# Patient Record
Sex: Female | Born: 1975 | Hispanic: Yes | Smoking: Never smoker
Health system: Southern US, Community
[De-identification: ages and names within clinical notes are randomized; demographics above are authoritative.]

## PROBLEM LIST (undated history)

## (undated) DIAGNOSIS — Z980 Intestinal bypass and anastomosis status: Secondary | ICD-10-CM

## (undated) DIAGNOSIS — G43909 Migraine, unspecified, not intractable, without status migrainosus: Secondary | ICD-10-CM

## (undated) DIAGNOSIS — F419 Anxiety disorder, unspecified: Secondary | ICD-10-CM

## (undated) DIAGNOSIS — F988 Other specified behavioral and emotional disorders with onset usually occurring in childhood and adolescence: Secondary | ICD-10-CM

## (undated) DIAGNOSIS — R5600 Simple febrile convulsions: Secondary | ICD-10-CM

## (undated) DIAGNOSIS — D649 Anemia, unspecified: Secondary | ICD-10-CM

## (undated) DIAGNOSIS — R41840 Attention and concentration deficit: Secondary | ICD-10-CM

## (undated) DIAGNOSIS — E611 Iron deficiency: Secondary | ICD-10-CM

## (undated) DIAGNOSIS — G629 Polyneuropathy, unspecified: Secondary | ICD-10-CM

## (undated) DIAGNOSIS — D508 Other iron deficiency anemias: Secondary | ICD-10-CM

## (undated) DIAGNOSIS — M1990 Unspecified osteoarthritis, unspecified site: Secondary | ICD-10-CM

## (undated) DIAGNOSIS — K219 Gastro-esophageal reflux disease without esophagitis: Secondary | ICD-10-CM

## (undated) DIAGNOSIS — Z8719 Personal history of other diseases of the digestive system: Secondary | ICD-10-CM

## (undated) DIAGNOSIS — E538 Deficiency of other specified B group vitamins: Secondary | ICD-10-CM

## (undated) HISTORY — DX: Polyneuropathy, unspecified: G62.9

## (undated) HISTORY — PX: HIATAL HERNIA REPAIR: SHX195

## (undated) HISTORY — PX: LAPAROSCOPIC REVISION OF ROUXENY WITH UPPER ENDOSCOPY: SHX5923

## (undated) HISTORY — PX: OTHER SURGICAL HISTORY: SHX169

---

## 2017-02-26 ENCOUNTER — Other Ambulatory Visit: Payer: Self-pay | Admitting: Student

## 2017-02-26 DIAGNOSIS — R1310 Dysphagia, unspecified: Secondary | ICD-10-CM

## 2017-02-26 DIAGNOSIS — K219 Gastro-esophageal reflux disease without esophagitis: Secondary | ICD-10-CM

## 2017-03-05 ENCOUNTER — Ambulatory Visit
Admission: RE | Admit: 2017-03-05 | Discharge: 2017-03-05 | Disposition: A | Discharge status: Home / Self Care | Payer: Federal, State, Local not specified - PPO | Source: Ambulatory Visit | Attending: Student | Admitting: Student

## 2017-03-05 DIAGNOSIS — R1310 Dysphagia, unspecified: Secondary | ICD-10-CM

## 2017-03-05 DIAGNOSIS — K449 Diaphragmatic hernia without obstruction or gangrene: Secondary | ICD-10-CM | POA: Diagnosis not present

## 2017-03-05 DIAGNOSIS — K219 Gastro-esophageal reflux disease without esophagitis: Secondary | ICD-10-CM | POA: Insufficient documentation

## 2017-03-10 DIAGNOSIS — K219 Gastro-esophageal reflux disease without esophagitis: Secondary | ICD-10-CM | POA: Insufficient documentation

## 2017-06-03 DIAGNOSIS — D62 Acute posthemorrhagic anemia: Secondary | ICD-10-CM | POA: Insufficient documentation

## 2017-06-30 ENCOUNTER — Encounter: Payer: Self-pay | Admitting: Emergency Medicine

## 2017-06-30 ENCOUNTER — Emergency Department
Admission: EM | Admit: 2017-06-30 | Discharge: 2017-06-30 | Disposition: A | Discharge status: Home / Self Care | Payer: Federal, State, Local not specified - PPO | Attending: Student in an Organized Health Care Education/Training Program | Admitting: Student in an Organized Health Care Education/Training Program

## 2017-06-30 DIAGNOSIS — T426X1A Poisoning by other antiepileptic and sedative-hypnotic drugs, accidental (unintentional), initial encounter: Secondary | ICD-10-CM | POA: Diagnosis not present

## 2017-06-30 DIAGNOSIS — T50901A Poisoning by unspecified drugs, medicaments and biological substances, accidental (unintentional), initial encounter: Secondary | ICD-10-CM

## 2017-06-30 LAB — COMPREHENSIVE METABOLIC PANEL
ALBUMIN: 4.3 g/dL (ref 3.5–5.0)
ALT: 11 U/L — AB (ref 14–54)
AST: 19 U/L (ref 15–41)
Alkaline Phosphatase: 65 U/L (ref 38–126)
Anion gap: 10 (ref 5–15)
BILIRUBIN TOTAL: 0.9 mg/dL (ref 0.3–1.2)
BUN: 15 mg/dL (ref 6–20)
CO2: 23 mmol/L (ref 22–32)
Calcium: 9 mg/dL (ref 8.9–10.3)
Chloride: 105 mmol/L (ref 101–111)
Creatinine, Ser: 0.88 mg/dL (ref 0.44–1.00)
GFR calc Af Amer: >60 mL/min (ref >60)
GFR calc non Af Amer: >60 mL/min (ref >60)
GLUCOSE: 117 mg/dL — AB (ref 65–99)
POTASSIUM: 3.2 mmol/L — AB (ref 3.5–5.1)
Sodium: 138 mmol/L (ref 135–145)
TOTAL PROTEIN: 7.6 g/dL (ref 6.5–8.1)

## 2017-06-30 LAB — URINE DRUG SCREEN, QUALITATIVE (ARMC ONLY)
AMPHETAMINES, UR SCREEN: POSITIVE — AB
Barbiturates, Ur Screen: NOT DETECTED
Benzodiazepine, Ur Scrn: POSITIVE — AB
Cannabinoid 50 Ng, Ur ~~LOC~~: NOT DETECTED
Cocaine Metabolite,Ur ~~LOC~~: NOT DETECTED
MDMA (Ecstasy)Ur Screen: NOT DETECTED
Methadone Scn, Ur: NOT DETECTED
OPIATE, UR SCREEN: NOT DETECTED
PHENCYCLIDINE (PCP) UR S: NOT DETECTED
Tricyclic, Ur Screen: POSITIVE — AB

## 2017-06-30 LAB — CBC
HEMATOCRIT: 34.7 % — AB (ref 35.0–47.0)
Hemoglobin: 11.3 g/dL — ABNORMAL LOW (ref 12.0–16.0)
MCH: 25 pg — ABNORMAL LOW (ref 26.0–34.0)
MCHC: 32.5 g/dL (ref 32.0–36.0)
MCV: 76.8 fL — AB (ref 80.0–100.0)
Platelets: 273 10*3/uL (ref 150–440)
RBC: 4.53 MIL/uL (ref 3.80–5.20)
RDW: 15.2 % — ABNORMAL HIGH (ref 11.5–14.5)
WBC: 5.7 10*3/uL (ref 3.6–11.0)

## 2017-06-30 LAB — ACETAMINOPHEN LEVEL: Acetaminophen (Tylenol), Serum: 13 ug/mL (ref 10–30)

## 2017-06-30 LAB — ETHANOL: Alcohol, Ethyl (B): <10 mg/dL (ref <10)

## 2017-06-30 LAB — SALICYLATE LEVEL: Salicylate Lvl: <7 mg/dL (ref 2.8–30.0)

## 2017-06-30 NOTE — Progress Notes (Signed)
Pt is a 42 y/o female who presents to [facility] ED via EMS from home relating to an accidental overdose on 2-3 Ambien. Presents sedated on initial contact with soft & slurred speech. Per pt "I didn't remember taking the first one so I took another one and fell asleep; my baby couldn't wake me up so he called 911, he's only 42 years old".  Pt denies SI, HI, AVH and pain at this time. Reports "just being tired with everything I have to do with my kids and I had so much driving to do back and forth from [place] then to [facility], I'm just too tired, I have not been sleeping but 3 hrs at most". Emotional support and encouragement offered. Safety checks initiated at Q 15 minutes intervals without self harm gestures to note at this time.

## 2017-06-30 NOTE — ED Provider Notes (Signed)
[facility] Emergency Department Provider Note    First MD Initiated Contact with Patient 06/30/17 1247     (approximate)  I have reviewed the triage vital signs and the nursing notes.   HISTORY  Chief Complaint Drug Overdose    HPI Nichole Gentry is a 42 y.o. female to the ER after accidental overdose of 2-3 of her home Ambien medications that she took around 8 AM this morning.  Patient states that she was getting home from an overnight late shift had got her eldest to school and was putting her 54-year-old down to take a nap.  She took what she thought were her morning medicines and started feeling drowsy.  She was planning on going to sleep anyways.  She looked and noted that she had already set out trazodone and Ambien to the side and realized that she had accidentally taken 2-3 additional Ambien medications.  This was unintentional.  She denies any SI or HI.  No previous history of depression or suicidal ideation.  The 26-year-old called EMS because she is they were worried about her mother being more sleepy.  DSS and CPS is involved.  On arrival by PD and EMS the mother and patient was slurring her words and did appear drowsy and intoxicated.  History reviewed. No pertinent past medical history. No family history on file. Past Surgical History:  Procedure Laterality Date  . HIATAL HERNIA REPAIR    . linx management system     There are no active problems to display for this patient.     Prior to Admission medications   Not on File    Allergies Patient has no known allergies.    Social History Social History   Tobacco Use  . Smoking status: Never Smoker  . Smokeless tobacco: Never Used  Substance Use Topics  . Alcohol use: Yes    Comment: occasionally  . Drug use: Not on file    Review of Systems Patient denies headaches, rhinorrhea, blurry vision, numbness, shortness of breath, chest pain, edema, cough, abdominal pain, nausea, vomiting,  diarrhea, dysuria, fevers, rashes or hallucinations unless otherwise stated above in HPI. ____________________________________________   PHYSICAL EXAM:  VITAL SIGNS: Vitals:   06/30/17 1220  BP: 117/84  Pulse: (!) 105  Resp: 18  Temp: 97.9 F (36.6 C)  SpO2: 100%    Constitutional: Alert and oriented. Well appearing and in no acute distress. Eyes: Conjunctivae are normal.  Head: Atraumatic. Nose: No congestion/rhinnorhea. Mouth/Throat: Mucous membranes are moist.   Neck: No stridor. Painless ROM.  Cardiovascular: Normal rate, regular rhythm. Grossly normal heart sounds.  Good peripheral circulation. Respiratory: Normal respiratory effort.  No retractions. Lungs CTAB. Gastrointestinal: Soft and nontender. No distention. No abdominal bruits. No CVA tenderness. Genitourinary:  Musculoskeletal: No lower extremity tenderness nor edema.  No joint effusions. Neurologic:  Normal speech and language. No gross focal neurologic deficits are appreciated. No facial droop Skin:  Skin is warm, dry and intact. No rash noted. Psychiatric: Mood and affect are normal. Speech and behavior are normal.  ____________________________________________   LABS (all labs ordered are listed, but only abnormal results are displayed)  Results for orders placed or performed during the hospital encounter of 06/30/17 (from the past 24 hour(s))  Comprehensive metabolic panel     Status: Abnormal   Collection Time: 06/30/17 12:27 PM  Result Value Ref Range   Sodium 138 135 - 145 mmol/L   Potassium 3.2 (L) 3.5 - 5.1 mmol/L   Chloride  105 101 - 111 mmol/L   CO2 23 22 - 32 mmol/L   Glucose, Bld 117 (H) 65 - 99 mg/dL   BUN 15 6 - 20 mg/dL   Creatinine, Ser 4.78 0.44 - 1.00 mg/dL   Calcium 9.0 8.9 - 29.5 mg/dL   Total Protein 7.6 6.5 - 8.1 g/dL   Albumin 4.3 3.5 - 5.0 g/dL   AST 19 15 - 41 U/L   ALT 11 (L) 14 - 54 U/L   Alkaline Phosphatase 65 38 - 126 U/L   Total Bilirubin 0.9 0.3 - 1.2 mg/dL   GFR  calc non Af Amer >60 >60 mL/min   GFR calc Af Amer >60 >60 mL/min   Anion gap 10 5 - 15  Ethanol     Status: None   Collection Time: 06/30/17 12:27 PM  Result Value Ref Range   Alcohol, Ethyl (B) <10 <10 mg/dL  Salicylate level     Status: None   Collection Time: 06/30/17 12:27 PM  Result Value Ref Range   Salicylate Lvl <7.0 2.8 - 30.0 mg/dL  Acetaminophen level     Status: None   Collection Time: 06/30/17 12:27 PM  Result Value Ref Range   Acetaminophen (Tylenol), Serum 13 10 - 30 ug/mL  cbc     Status: Abnormal   Collection Time: 06/30/17 12:27 PM  Result Value Ref Range   WBC 5.7 3.6 - 11.0 K/uL   RBC 4.53 3.80 - 5.20 MIL/uL   Hemoglobin 11.3 (L) 12.0 - 16.0 g/dL   HCT 62.1 (L) 30.8 - 65.7 %   MCV 76.8 (L) 80.0 - 100.0 fL   MCH 25.0 (L) 26.0 - 34.0 pg   MCHC 32.5 32.0 - 36.0 g/dL   RDW 84.6 (H) 96.2 - 95.2 %   Platelets 273 150 - 440 K/uL  Urine Drug Screen, Qualitative     Status: Abnormal   Collection Time: 06/30/17 12:27 PM  Result Value Ref Range   Tricyclic, Ur Screen POSITIVE (A) NONE DETECTED   Amphetamines, Ur Screen POSITIVE (A) NONE DETECTED   MDMA (Ecstasy)Ur Screen NONE DETECTED NONE DETECTED   Cocaine Metabolite,Ur [place] NONE DETECTED NONE DETECTED   Opiate, Ur Screen NONE DETECTED NONE DETECTED   Phencyclidine (PCP) Ur S NONE DETECTED NONE DETECTED   Cannabinoid 50 Ng, Ur [place] NONE DETECTED NONE DETECTED   Barbiturates, Ur Screen NONE DETECTED NONE DETECTED   Benzodiazepine, Ur Scrn POSITIVE (A) NONE DETECTED   Methadone Scn, Ur NONE DETECTED NONE DETECTED   ____________________________________________  EKG My review and personal interpretation at Time: 13:23   Indication: overdose  Rate: 70  Rhythm: sinus Axis: normal Other: normal intervals, no stemi ____________________________________________  RADIOLOGY  I personally reviewed all radiographic images ordered to evaluate for the above acute complaints and reviewed radiology reports and findings.   These findings were personally discussed with the patient.  Please see medical record for radiology report.  ____________________________________________   PROCEDURES  Procedure(s) performed:  Procedures    Critical Care performed: no ____________________________________________   INITIAL IMPRESSION / ASSESSMENT AND PLAN / ED COURSE  Pertinent labs & imaging results that were available during my care of the patient were reviewed by me and considered in my medical decision making (see chart for details).  DDX: Accidental overdose, intentional overdose, polysubstance abuse, SI, HI  Ellenore Roscoe is a 42 y.o. who presents to the ED with symptoms as described above.  Patient is still a bit drowsy but seems to be metabolizing  and becoming more alert.  Adamantly denies any intentional overdose.  Denies any history of SI or HI.  States she does have underlying anxiety and takes medications for that as well as ADHD.  Based on her presentation does not meeting criteria for IVC.  I will touch base with poison control to ensure there is no indication for prolonged monitoring patient otherwise appears clinically sober at this time.  Clinical Course as of Jul 01 1418  Tue Jun 30, 2017  1401 Spoke with poison control regarding presentation. Recommend 4hrs from ingestion which was roughly 8:00 this morning.  Patient sitting upright with no drowsiness.  No SI or HI.  Patient medically clear and stable for discharge home.   [PR]    Clinical Course User Index [PR] Willy Eddy, MD     As part of my medical decision making, I reviewed the following data within the electronic MEDICAL RECORD NUMBER Nursing notes reviewed and incorporated, Labs reviewed, notes from prior ED visits.   ____________________________________________   FINAL CLINICAL IMPRESSION(S) / ED DIAGNOSES  Final diagnoses:  Accidental drug overdose, initial encounter      NEW MEDICATIONS STARTED DURING THIS VISIT:  New  Prescriptions   No medications on file     Note:  This document was prepared using Dragon voice recognition software and may include unintentional dictation errors.    Willy Eddy, MD 06/30/17 1420

## 2017-06-30 NOTE — ED Notes (Signed)
Red, black and beige purse, orange tennis shoes, black t-shirt and black shorts placed in storage.

## 2017-06-30 NOTE — ED Triage Notes (Signed)
Patient presents to the ED via EMS from home.  Patient has slightly slurred speech and eyes are heavy.  Patient states she had surgery for a hiatal hernia repair approximately 2 weeks ago but since then has had, "dumping syndrome" having diarrhea anytime she ate or drank anything.  Patient states due to this she has not been sleeping well.  Patient states her psychiatrist prescribes her Remus Loffler for sleep.  Patient states she got in late last night from work.  Patient states she didn't sleep well during the night.  Patient states she, "got son ready for school and then was going to take her ambien around 11am."  Patient states she thinks she accidentally took one of her Remus Loffler and then forgot that she had and then took another one. This RN asked patient about whether she took her son to school.  She stated, " no he was outside yesterday and got hot and sort of had a fever so I was just going to keep him in today."  Patient's son called 9-1-1 because he was not able to wake patient up.  He is 43 years old.  [place] police are with son at this time at the home, waiting on social services to arrive.  Patient is short approx. 6-7 ambien.  The prescription was 06/15/17 with QTY: 30.  There is only 8 left.  Patient also has pill bottles for lorazepam 0.5mg , trazodone, Amphetamine Salts, Vyvanse, gabapentin, and & trazodone.

## 2017-06-30 NOTE — Discharge Instructions (Addendum)
Do  not drive until all effects of ambien have warn off.

## 2017-10-08 DIAGNOSIS — Z9884 Bariatric surgery status: Secondary | ICD-10-CM | POA: Insufficient documentation

## 2017-12-08 DIAGNOSIS — Z980 Intestinal bypass and anastomosis status: Secondary | ICD-10-CM | POA: Insufficient documentation

## 2017-12-21 ENCOUNTER — Other Ambulatory Visit: Payer: Self-pay

## 2017-12-21 ENCOUNTER — Ambulatory Visit
Admission: EM | Admit: 2017-12-21 | Discharge: 2017-12-21 | Disposition: A | Discharge status: Home / Self Care | Payer: Federal, State, Local not specified - PPO | Attending: Family Medicine | Admitting: Family Medicine

## 2017-12-21 ENCOUNTER — Encounter: Payer: Self-pay | Admitting: Emergency Medicine

## 2017-12-21 DIAGNOSIS — H6503 Acute serous otitis media, bilateral: Secondary | ICD-10-CM

## 2017-12-21 DIAGNOSIS — J069 Acute upper respiratory infection, unspecified: Secondary | ICD-10-CM | POA: Diagnosis not present

## 2017-12-21 HISTORY — DX: Migraine, unspecified, not intractable, without status migrainosus: G43.909

## 2017-12-21 HISTORY — DX: Gastro-esophageal reflux disease without esophagitis: K21.9

## 2017-12-21 HISTORY — DX: Other specified behavioral and emotional disorders with onset usually occurring in childhood and adolescence: F98.8

## 2017-12-21 LAB — RAPID INFLUENZA A&B ANTIGENS (ARMC ONLY)
INFLUENZA A (ARMC): NEGATIVE
INFLUENZA B (ARMC): NEGATIVE

## 2017-12-21 MED ORDER — AMOXICILLIN 875 MG PO TABS: 875.0000 mg | ORAL_TABLET | Freq: Two times a day (BID) | ORAL | 0 refills | Status: DC

## 2017-12-21 NOTE — ED Notes (Signed)
Upon discharge patient became verbally abusive to clinical staff. Patient was upset that she received antibiotics for an ear infection "virus". Patient was irate with myself and Tora Kindredaula Thomas, CMA. Patient used very colorful language with staff.

## 2017-12-21 NOTE — ED Provider Notes (Signed)
[facility]    CSN: [id number] Arrival date & time: 12/21/17  1742     History   Chief Complaint Chief Complaint  Patient presents with  . Cough  . Nasal Congestion    HPI Nichole Gentry is a 42 y.o. female.   The history is provided by the patient.  Cough  Associated symptoms: rhinorrhea   Associated symptoms: no wheezing   URI  Presenting symptoms: congestion, cough and rhinorrhea   Severity:  Moderate Onset quality:  Sudden Duration:  4 days Timing:  Constant Progression:  Unchanged Chronicity:  New Relieved by:  Nothing Ineffective treatments:  OTC medications Associated symptoms: no sinus pain and no wheezing   Risk factors: sick contacts   Risk factors: not elderly, no chronic cardiac disease, no chronic kidney disease, no chronic respiratory disease, no diabetes mellitus, no immunosuppression, no recent illness and no recent travel     Past Medical History:  Diagnosis Date  . ADD (attention deficit disorder)   . GERD (gastroesophageal reflux disease)   . Migraine     There are no active problems to display for this patient.   Past Surgical History:  Procedure Laterality Date  . HIATAL HERNIA REPAIR    . LAPAROSCOPIC REVISION OF ROUXENY WITH UPPER ENDOSCOPY    . linx management system      OB History   None      Home Medications    Prior to Admission medications   Medication Sig Start Date End Date Taking? Authorizing Provider  Butalbital-APAP-Caffeine 50-325-40 MG capsule Take by mouth.   Yes [provider]  Multiple Vitamin (MULTI-VITAMINS) TABS Take by mouth.   Yes [provider]  ondansetron (ZOFRAN-ODT) 4 MG disintegrating tablet DISSOLVE 1 TABLET IN MOUTH THREE TIMES DAILY AS NEEDED 07/21/17  Yes [provider]  pantoprazole (PROTONIX) 40 MG tablet Take by mouth.   Yes [provider]  SUMAtriptan (IMITREX) 100 MG tablet Take by mouth.   Yes [provider]  VYVANSE 70 MG  capsule TAKE 1 CAPSULE BY MOUTH IN THE MORNING ONCE DAILY FOR 30 DAYS 11/26/17  Yes [provider]  amoxicillin (AMOXIL) 875 MG tablet Take 1 tablet (875 mg total) by mouth 2 (two) times daily. 12/21/17   Payton Mccallumonty, Pamula Luther, MD    Family History Family History  Problem Relation Age of Onset  . Thyroid disease Mother   . Diabetes Mother   . Hyperlipidemia Father   . Lung cancer Father   . Brain cancer Father     Social History Social History   Tobacco Use  . Smoking status: Never Smoker  . Smokeless tobacco: Never Used  Substance Use Topics  . Alcohol use: Yes    Comment: occasionally  . Drug use: Never     Allergies   Patient has no known allergies.   Review of Systems Review of Systems  HENT: Positive for congestion and rhinorrhea. Negative for sinus pain.   Respiratory: Positive for cough. Negative for wheezing.      Physical Exam Triage Vital Signs ED Triage Vitals  Enc Vitals Group     BP 12/21/17 1800 114/83     Pulse Rate 12/21/17 1800 (!) 107     Resp 12/21/17 1800 16     Temp 12/21/17 1800 98.6 F (37 C)     Temp Source 12/21/17 1800 Oral     SpO2 12/21/17 1800 100 %     Weight 12/21/17 1800 133 lb (60.3 kg)  Height 12/21/17 1800 5\' 6"  (1.676 m)     Head Circumference --      Peak Flow --      Pain Score 12/21/17 1759 7     Pain Loc --      Pain Edu? --      Excl. in GC? --    No data found.  Updated Vital Signs BP 114/83 (BP Location: Left Arm)   Pulse (!) 107   Temp 98.6 F (37 C) (Oral)   Resp 16   Ht 5\' 6"  (1.676 m)   Wt 60.3 kg   LMP 11/30/2017 (Approximate)   SpO2 100%   BMI 21.47 kg/m   Visual Acuity Right Eye Distance:   Left Eye Distance:   Bilateral Distance:    Right Eye Near:   Left Eye Near:    Bilateral Near:     Physical Exam  Constitutional: She appears well-developed and well-nourished. No distress.  HENT:  Head: Normocephalic and atraumatic.  Right Ear: External ear and ear canal normal. Tympanic  membrane is erythematous and bulging. A middle ear effusion is present.  Left Ear: External ear and ear canal normal. Tympanic membrane is erythematous and bulging. A middle ear effusion is present.  Nose: Mucosal edema and rhinorrhea present. No nose lacerations, sinus tenderness, nasal deformity, septal deviation or nasal septal hematoma. No epistaxis.  No foreign bodies.  Mouth/Throat: Uvula is midline and mucous membranes are normal. Posterior oropharyngeal erythema present. No oropharyngeal exudate, posterior oropharyngeal edema or tonsillar abscesses. No tonsillar exudate.  Eyes: Pupils are equal, round, and reactive to light. Conjunctivae and EOM are normal. Right eye exhibits no discharge. Left eye exhibits no discharge. No scleral icterus.  Neck: Normal range of motion. Neck supple. No thyromegaly present.  Cardiovascular: Normal rate, regular rhythm and normal heart sounds.  Pulmonary/Chest: Effort normal and breath sounds normal. No respiratory distress. She has no wheezes. She has no rales.  Lymphadenopathy:    She has no cervical adenopathy.  Skin: She is not diaphoretic.  Nursing note and vitals reviewed.    UC Treatments / Results  Labs (all labs ordered are listed, but only abnormal results are displayed) Labs Reviewed  RAPID INFLUENZA A&B ANTIGENS ([facility] ONLY)    EKG None  Radiology No results found.  Procedures Procedures (including critical care time)  Medications Ordered in UC Medications - No data to display  Initial Impression / Assessment and Plan / UC Course  I have reviewed the triage vital signs and the nursing notes.  Pertinent labs & imaging results that were available during my care of the patient were reviewed by me and considered in my medical decision making (see chart for details).      Final Clinical Impressions(s) / UC Diagnoses   Final diagnoses:  Bilateral acute serous otitis media, recurrence not specified  Acute upper respiratory  infection    ED Prescriptions    Medication Sig Dispense Auth. Provider   amoxicillin (AMOXIL) 875 MG tablet Take 1 tablet (875 mg total) by mouth 2 (two) times daily. 20 tablet Payton Mccallum, MD      1. Lab results and diagnosis reviewed with patient 2. rx as per orders above; reviewed possible side effects, interactions, risks and benefits  3. Recommend supportive treatment with continued symptomatic tx with otc meds  4. Follow-up prn if symptoms worsen or don't improve   Controlled Substance Prescriptions [place] Controlled Substance Registry consulted? Not Applicable   Payton Mccallum, MD 12/21/17 Ebony Cargo

## 2017-12-21 NOTE — ED Triage Notes (Signed)
Patient in today c/o nasal congestion, cough, headache x 3 days. Patient has tried OTC Mucinex, Nyquil, Dayquil and Tessalon Perles without relief. Patient has had chills and has felt feverish, but hasn't taken her temperature.

## 2019-10-06 ENCOUNTER — Other Ambulatory Visit: Payer: Self-pay | Admitting: Gastroenterology

## 2019-10-06 DIAGNOSIS — K2100 Gastro-esophageal reflux disease with esophagitis, without bleeding: Secondary | ICD-10-CM

## 2019-10-13 ENCOUNTER — Ambulatory Visit
Admission: RE | Admit: 2019-10-13 | Discharge: 2019-10-13 | Disposition: A | Discharge status: Home / Self Care | Payer: Federal, State, Local not specified - PPO | Source: Ambulatory Visit | Attending: Gastroenterology | Admitting: Gastroenterology

## 2019-10-13 ENCOUNTER — Other Ambulatory Visit: Payer: Self-pay | Admitting: Gastroenterology

## 2019-10-13 ENCOUNTER — Other Ambulatory Visit: Payer: Self-pay

## 2019-10-13 DIAGNOSIS — K2100 Gastro-esophageal reflux disease with esophagitis, without bleeding: Secondary | ICD-10-CM

## 2019-10-17 ENCOUNTER — Encounter: Payer: Self-pay | Admitting: Hematology and Oncology

## 2019-10-17 NOTE — Progress Notes (Signed)
Patient called/ screened for oncology follow-up appointment, expresses complaints of fatigue, palpations, and lightheaded.

## 2019-10-17 NOTE — Progress Notes (Signed)
[facility]  [address], [address] [place], [place] [address] Phone: [phone]  Fax: [phone]   Clinic Day:  10/18/2019  Referring physician: [name]  Chief Complaint: Nichole Gentry is a 44 y.o. female s/p gastrojejunostomy (2019) with iron deficiency and B12 deficiency who is referred in consultation by Nichole Pat, NP for assessment and management.   HPI: The patient saw Nichole Gentry on 10/07/2019.  She was felt to have possible celiac disease based on an EGD in 2019 and elevated tissue transglutaminase IgA (30).  EGD in 2019 revealed a hiatal hernia, mildly dilated esophagus and dysmotility esophagus.  There were fundic gland polyps.  Duodenal biopsies revealed some increase in epithelial lymphocytes.  Colonoscopy on 04/22/2019 revealed only hemorrhoids.  She is on a gluten-free diet due to celiac disease.  Follow-up EGD is scheduled.  The patient had a Linx band placed in 2019. She did not tolerate this well and underwent Linx removal and gastrojejunostomy on 12/07/2017. The surgery was supposed to control her reflux but it did not help. She is dependent on Protonix 40 mg BID.   The patient states that she was anemic before the surgeries. She has a low B12. She has tried oral iron in the past but could not tolerate it due to nausea and vomiting.  She has never received IV iron. She has received 4 weekly B12 injections (last one this morning) and will now be switching to monthly. She receives the injections at the GI clinic and states that she does not feel any better.  Labs followed: 06/03/2017:  Hematocrit 32.4, hemoglobin 10.2, MCV  79.0, platelets 207,000, WBC 4,600. 10/21/2017:  Hematocrit 34.4, hemoglobin 10.9, MCV  70.7, platelets 262,000, WBC 5,600. Ferritin 4. Iron saturation   3%. TIBC 488.0. Vitamin B12 152. 12/09/2017:  Hematocrit 29.0, hemoglobin   8.6, MCV  80.0, platelets 178,000, WBC 4,900. 03/30/2019:  Hematocrit  34.6, hemoglobin 10.5, MCV  73.9, platelets 315,000, WBC 6,900. Ferritin 4. Iron saturation   4%. TIBC 599.5. Vitamin B12 145. 10/07/2019:  Hematocrit 29.9, hemoglobin   8.9, MCV  73.1, platelets 266,000, WBC 5400.  Ferritin 3. Iron saturation 10%  TIBC 406.2. Vitamin B12 131.  Symptomatically, she is doing "ok".  She reports fatigue, drenching night sweats several times per month, blurry vision, photosensitivity, palpitations x 1 week, loose stools, reflux, nausea, urinary frequency, occasional ankle and toe cramping, frequent headaches, 2 syncopal episodes over the past few years,  and lightheadedness. She has ice pica, restless legs, and is always cold.  The patient denies fevers, headaches, changes in vision, runny nose, dry mouth, sore throat, cough, shortness of breath, chest pain, vomiting, reflux, bone or joint symptoms, skin changes, numbness, and bleeding of any kind.  The patient does not eat bread. She tries to eat mostly chicken and fish because they are easier to chew. She occasionally eats ground beef or ground [place]. She does not eat a lot of carbs or sweets.  Screening mammogram on 04/07/2019 revealed no evidence of malignancy.  The patient's brother had a clotting disorder. The patient states that "his body forms extra clots" and he is on a blood thinner. Her father passed from lung cancer with brain metastasis. Her son had follicular thyroid cancer in his early 31s and underwent thyroidectomy.   Past Medical History:  Diagnosis Date  . ADD (attention deficit disorder)   . GERD (gastroesophageal reflux disease)   . Migraine     Past Surgical History:  Procedure Laterality  Date  . HIATAL HERNIA REPAIR    . LAPAROSCOPIC REVISION OF ROUXENY WITH UPPER ENDOSCOPY    . linx management system      Family History  Problem Relation Age of Onset  . Thyroid disease Mother   . Diabetes Mother   . Hyperlipidemia Father   . Lung cancer Father   . Brain cancer Father      Social History:  reports that she has never smoked. She has never used smokeless tobacco. She reports current alcohol use. She reports that she does not use drugs. She does not smoke. She drinks alcohol occasionally. 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 She lives in [place]. The patient is alone today.  Allergies: No Known Allergies  Current Medications: Current Outpatient Medications  Medication Sig Dispense Refill  . amphetamine-dextroamphetamine (ADDERALL) 10 MG tablet Take by mouth.    . Butalbital-APAP-Caffeine 50-325-40 MG capsule Take by mouth.     . cyanocobalamin (,VITAMIN B-12,) 1000 MCG/ML injection Inject into the muscle.    . meloxicam (MOBIC) 7.5 MG tablet Take 7.5 mg by mouth daily.    . metroNIDAZOLE (FLAGYL) 500 MG tablet SMARTSIG:1 Tablet(s) By Mouth Every 12 Hours    . Multiple Vitamin (MULTI-VITAMINS) TABS Take by mouth.     . pantoprazole (PROTONIX) 40 MG tablet Take by mouth.    . SUMAtriptan (IMITREX) 100 MG tablet Take by mouth.    Marland Kitchen VYVANSE 70 MG capsule TAKE 1 CAPSULE BY MOUTH IN THE MORNING ONCE DAILY FOR 30 DAYS  0  . zolpidem (AMBIEN) 10 MG tablet Take by mouth.    Marland Kitchen amoxicillin (AMOXIL) 875 MG tablet Take 1 tablet (875 mg total) by mouth 2 (two) times daily. (Patient not taking: Reported on 10/17/2019) 20 tablet 0  . ondansetron (ZOFRAN-ODT) 4 MG disintegrating tablet DISSOLVE 1 TABLET IN MOUTH THREE TIMES DAILY AS NEEDED (Patient not taking: Reported on 10/17/2019)     No current facility-administered medications for this visit.    Review of Systems  Constitutional: Positive for chills (at night), diaphoresis (drenching night sweats) and malaise/fatigue. Negative for fever and weight loss.       "Tired all the time".  HENT: Negative for congestion, ear discharge, ear pain, hearing loss, nosebleeds, sinus pain, sore throat and tinnitus.        Dry mouth.  Eyes: Positive for blurred vision and photophobia. Negative for double vision.  Respiratory:  Negative for cough, hemoptysis, sputum production and shortness of breath.   Cardiovascular: Negative for chest pain, palpitations and leg swelling.  Gastrointestinal: Positive for heartburn and nausea (depending on what she eats). Negative for abdominal pain, blood in stool, constipation, diarrhea (loose stools), melena and vomiting.       Ice pica.  Genitourinary: Positive for frequency. Negative for dysuria, hematuria and urgency.  Musculoskeletal: Positive for myalgias (occasional ankle and toe cramping). Negative for back pain, joint pain and neck pain.  Skin: Negative for itching and rash.  Neurological: Positive for dizziness (lightheaded, multiple syncopal episodes over the past 2 years) and headaches (frequent). Negative for tingling, sensory change and weakness.       Restless legs.  Endo/Heme/Allergies: Does not bruise/bleed easily.       Always cold.  Psychiatric/Behavioral: Negative for depression and memory loss. The patient is not nervous/anxious and does not have insomnia.   All other systems reviewed and are negative.  Performance status (ECOG): 1  Vitals Blood pressure 111/81, pulse 79, temperature 97.7 F (36.5 C), temperature source  Tympanic, resp. rate 18, height 5\' 6"  (1.676 m), weight 136 lb 11 oz (62 kg), SpO2 100 %.   Physical Exam Vitals and nursing note reviewed.  Constitutional:      General: She is not in acute distress.    Appearance: She is not diaphoretic.  HENT:     Head: Normocephalic and atraumatic.     Comments: Brown hair pulled up.    Mouth/Throat:     Mouth: Mucous membranes are dry.     Pharynx: Oropharynx is clear.  Eyes:     General: No scleral icterus.    Extraocular Movements: Extraocular movements intact.     Conjunctiva/sclera: Conjunctivae normal.     Pupils: Pupils are equal, round, and reactive to light.     Comments: Brown/dark hazel eyes.  Cardiovascular:     Rate and Rhythm: Normal rate and regular rhythm.     Heart sounds:  Normal heart sounds. No murmur heard.   Pulmonary:     Effort: Pulmonary effort is normal. No respiratory distress.     Breath sounds: Normal breath sounds. No wheezing or rales.  Chest:     Chest wall: No tenderness.  Abdominal:     General: Bowel sounds are normal. There is no distension.     Palpations: Abdomen is soft. There is no hepatomegaly, splenomegaly or mass.     Tenderness: There is abdominal tenderness (mild). There is no guarding or rebound.  Musculoskeletal:        General: No swelling or tenderness. Normal range of motion.     Cervical back: Normal range of motion and neck supple.  Lymphadenopathy:     Head:     Right side of head: No preauricular, posterior auricular or occipital adenopathy.     Left side of head: No preauricular, posterior auricular or occipital adenopathy.     Cervical: No cervical adenopathy.     Upper Body:     Right upper body: No supraclavicular or axillary adenopathy.     Left upper body: No supraclavicular or axillary adenopathy.     Lower Body: No right inguinal adenopathy. No left inguinal adenopathy.  Skin:    General: Skin is warm and dry.     Comments: Tan.  Neurological:     Mental Status: She is alert and oriented to person, place, and time.  Psychiatric:        Behavior: Behavior normal.        Thought Content: Thought content normal.        Judgment: Judgment normal.    No visits with results within 3 Day(s) from this visit.  Latest known visit with results is:  Admission on 12/21/2017, Discharged on 12/21/2017  Component Date Value Ref Range Status  . Influenza A ([facility]) 12/21/2017 NEGATIVE  NEGATIVE Final  . Influenza B ([facility]) 12/21/2017 NEGATIVE  NEGATIVE Final   Performed at [facility], [address], [place], [place]    Assessment:  Nichole Gentry is a 44 y.o. female s/p gastric bypass surgery and possible celiac disease with iron deficiency and B12 deficiency.  She is intolerant of oral  iron.  Labs on 10/07/2019 revealed a hematocrit 29.9, hemoglobin 8.9, MCV 73.1, platelets 266,000, WBC 5400.  Ferritin was 3. Iron saturation was 10% with a TIBC 406.2. Vitamin B12 was 131.  She has iron deficiency.  Ferritin was 3 on 10/07/2019.  She has B12 deficiency. She received B12 weekly x4 (last 10/18/2019).  EGD in 2019 revealed a  hiatal hernia, mildly dilated esophagus and dysmotility esophagus.  There were fundic gland polyps.  Duodenal biopsies revealed some increase in epithelial lymphocytes.  Colonoscopy on 04/22/2019 revealed only hemorrhoids.  She is on a gluten-free diet.  Follow-up EGD is scheduled.  Symptomatically, she notes fatigue, drenching night sweats several times per month, loose stools, reflux, nausea, frequent headaches, 2 syncopal episodes over the past few years,and lightheadedness. She has ice pica, restless legs, and always feels cold. Exam is unremarkable.  Plan: 1.   Labs today:  CBC with diff, folate. 2.   Iron deficiency  Patient has iron deficiency anemia likely secondary to gastrojejunostomy and possible celiac disease.  She describes anemia prior to her surgeries.  EGD and colonoscopy revealed no evidence of blood loss.  She is intolerant of oral iron.  Discuss consideration of IV iron.   Potential side effects reviewed.  Information provided.   Patient consented to treatment.  Preauth Venofer-done.  Venofer today. Urine pregnancy test required prior to infusions. 3.   B12 deficiency  Patient has B12 deficiency likely secondary to gastrojejunostomy.  B12 was 131 on 10/07/2019.  B12 goal is 400.  She has received B12 weekly x 4 (last this morning).  Anticipate B12 monthly.   Preauth B12-done. 4.  RTC in 1 week for MD assessment, review of work-up, urine pregnancy test, and week #2 Venofer +/- B12.  I discussed the assessment and treatment plan with the patient.  The patient was provided an opportunity to ask questions and all were answered.  The  patient agreed with the plan and demonstrated an understanding of the instructions.  The patient was advised to call back if the symptoms worsen or if the condition fails to improve as anticipated.  I provided 26 minutes of face-to-face time during this this encounter and > 50% was spent counseling as documented under my assessment and plan. An additional 15 minutes were spent reviewing her chart (Epic and Care Everywhere) including notes, labs, and imaging studies.    Agostino Gorin C. Merlene Pullingorcoran, MD, PhD    10/18/2019, 10:54 AM  I, Danella PentonEmily J Tufford, am acting as Neurosurgeonscribe for General MotorsMelissa C. Merlene Pullingorcoran, MD, PhD.  I, Leana Springston C. Merlene Pullingorcoran, MD, have reviewed the above documentation for accuracy and completeness, and I agree with the above.

## 2019-10-18 ENCOUNTER — Inpatient Hospital Stay: Payer: Federal, State, Local not specified - PPO

## 2019-10-18 ENCOUNTER — Other Ambulatory Visit: Payer: Self-pay

## 2019-10-18 ENCOUNTER — Inpatient Hospital Stay
Payer: Federal, State, Local not specified - PPO | Attending: Hematology and Oncology | Admitting: Hematology and Oncology

## 2019-10-18 ENCOUNTER — Encounter: Payer: Self-pay | Admitting: Hematology and Oncology

## 2019-10-18 VITALS — BP 111/81 | HR 79 | Temp 97.7°F | Resp 18 | Ht 66.0 in | Wt 136.7 lb

## 2019-10-18 VITALS — BP 110/76 | HR 76 | Resp 18

## 2019-10-18 DIAGNOSIS — K9589 Other complications of other bariatric procedure: Secondary | ICD-10-CM

## 2019-10-18 DIAGNOSIS — Z791 Long term (current) use of non-steroidal anti-inflammatories (NSAID): Secondary | ICD-10-CM | POA: Insufficient documentation

## 2019-10-18 DIAGNOSIS — Z9884 Bariatric surgery status: Secondary | ICD-10-CM | POA: Insufficient documentation

## 2019-10-18 DIAGNOSIS — D508 Other iron deficiency anemias: Secondary | ICD-10-CM

## 2019-10-18 DIAGNOSIS — E538 Deficiency of other specified B group vitamins: Secondary | ICD-10-CM | POA: Insufficient documentation

## 2019-10-18 DIAGNOSIS — D509 Iron deficiency anemia, unspecified: Secondary | ICD-10-CM

## 2019-10-18 DIAGNOSIS — Z79899 Other long term (current) drug therapy: Secondary | ICD-10-CM | POA: Diagnosis not present

## 2019-10-18 DIAGNOSIS — K219 Gastro-esophageal reflux disease without esophagitis: Secondary | ICD-10-CM | POA: Insufficient documentation

## 2019-10-18 LAB — VITAMIN B12: Vitamin B-12: >7500 pg/mL — ABNORMAL HIGH (ref 180–914)

## 2019-10-18 LAB — PREGNANCY, URINE: Preg Test, Ur: NEGATIVE

## 2019-10-18 LAB — CBC WITH DIFFERENTIAL/PLATELET
Abs Immature Granulocytes: 0.01 10*3/uL (ref 0.00–0.07)
Basophils Absolute: 0 10*3/uL (ref 0.0–0.1)
Basophils Relative: 1 %
Eosinophils Absolute: 0 10*3/uL (ref 0.0–0.5)
Eosinophils Relative: 1 %
HCT: 30.8 % — ABNORMAL LOW (ref 36.0–46.0)
Hemoglobin: 9.5 g/dL — ABNORMAL LOW (ref 12.0–15.0)
Immature Granulocytes: 0 %
Lymphocytes Relative: 49 %
Lymphs Abs: 2.2 10*3/uL (ref 0.7–4.0)
MCH: 21.7 pg — ABNORMAL LOW (ref 26.0–34.0)
MCHC: 30.8 g/dL (ref 30.0–36.0)
MCV: 70.5 fL — ABNORMAL LOW (ref 80.0–100.0)
Monocytes Absolute: 0.3 10*3/uL (ref 0.1–1.0)
Monocytes Relative: 8 %
Neutro Abs: 1.8 10*3/uL (ref 1.7–7.7)
Neutrophils Relative %: 41 %
Platelets: 301 10*3/uL (ref 150–400)
RBC: 4.37 MIL/uL (ref 3.87–5.11)
RDW: 16.1 % — ABNORMAL HIGH (ref 11.5–15.5)
WBC: 4.4 10*3/uL (ref 4.0–10.5)
nRBC: 0 % (ref 0.0–0.2)

## 2019-10-18 LAB — FOLATE: Folate: 18.1 ng/mL (ref >5.9)

## 2019-10-18 MED ORDER — CYANOCOBALAMIN 1000 MCG/ML IJ SOLN: 1000.0000 ug | Freq: Once | INTRAMUSCULAR | Status: DC

## 2019-10-18 MED ORDER — SODIUM CHLORIDE 0.9 % IV SOLN
Freq: Once | INTRAVENOUS | Status: AC
  Filled 2019-10-18: qty 250

## 2019-10-18 MED ORDER — SODIUM CHLORIDE 0.9 % IV SOLN: 200.0000 mg | Freq: Once | INTRAVENOUS | Status: DC

## 2019-10-18 MED ORDER — IRON SUCROSE 20 MG/ML IV SOLN
200.0000 mg | Freq: Once | INTRAVENOUS | Status: AC
  Administered 2019-10-18: 200 mg via INTRAVENOUS
  Filled 2019-10-18: qty 10

## 2019-10-18 NOTE — Patient Instructions (Signed)

## 2019-10-25 ENCOUNTER — Inpatient Hospital Stay: Payer: Federal, State, Local not specified - PPO

## 2019-10-25 ENCOUNTER — Inpatient Hospital Stay: Payer: Federal, State, Local not specified - PPO | Admitting: Hematology and Oncology

## 2019-10-29 NOTE — Progress Notes (Signed)
[facility]  [address] [place], [place] [address] Phone: [phone]  Fax: [phone]   Clinic Day:  10/31/2019  Referring physician: Evelene Croon, MD  Chief Complaint: Nichole Gentry is a 44 y.o. female with iron deficiency and B12 deficiency who is seen for review of work-up and continuation of weekly Venofer.   HPI: The patient was last seen in the hematology clinic on 10/18/2019 for initial consultation. At that time, she described fatigue, drenching night sweats, muscle cramping, headaches, ice pica, and cold chills. Hematocrit was 30.8, hemoglobin 9.5, MCV 70.5, platelets 301,000, WBC 4400, ANC 1800. Vit B12 was >7500. Folate was 18.1  During the interim, the patient stated feeling "busy". She tolerated IV iron well. She felt more tired after her infusion x 2 days. She had dark urine after IV iron; urine was brown in color. Tissue upon wiping was brown in color. Two days after infusion her urine was slightly brown and cleared by the third day. She reports being so busy that she has a foggy memory. She has had drenching sweats on and off since 2019. Her symptoms are unchanged since last visit. Denies any recent syncope episode.   She reports an upcoming appointment with GI for an ultrasound/EGD.   Pharmacist confirmed 5% of patient have urine color changes after IV Venofer. Patient was made aware and understood.    Past Medical History:  Diagnosis Date  . ADD (attention deficit disorder)   . GERD (gastroesophageal reflux disease)   . Migraine     Past Surgical History:  Procedure Laterality Date  . HIATAL HERNIA REPAIR    . LAPAROSCOPIC REVISION OF ROUXENY WITH UPPER ENDOSCOPY    . linx management system      Family History  Problem Relation Age of Onset  . Thyroid disease Mother   . Diabetes Mother   . Hyperlipidemia Father   . Lung cancer Father   . Brain cancer Father     Social History:  reports that she has never  smoked. She has never used smokeless tobacco. She reports current alcohol use. She reports that she does not use drugs. The patient is alone today.  Allergies: No Known Allergies  Current Medications: Current Outpatient Medications  Medication Sig Dispense Refill  . amoxicillin (AMOXIL) 875 MG tablet Take 1 tablet (875 mg total) by mouth 2 (two) times daily. (Patient not taking: Reported on 10/17/2019) 20 tablet 0  . amphetamine-dextroamphetamine (ADDERALL) 10 MG tablet Take by mouth.    . Butalbital-APAP-Caffeine 50-325-40 MG capsule Take by mouth.     . meloxicam (MOBIC) 7.5 MG tablet Take 7.5 mg by mouth daily.    . metroNIDAZOLE (FLAGYL) 500 MG tablet SMARTSIG:1 Tablet(s) By Mouth Every 12 Hours    . Multiple Vitamin (MULTI-VITAMINS) TABS Take by mouth.     . ondansetron (ZOFRAN-ODT) 4 MG disintegrating tablet DISSOLVE 1 TABLET IN MOUTH THREE TIMES DAILY AS NEEDED (Patient not taking: Reported on 10/17/2019)    . pantoprazole (PROTONIX) 40 MG tablet Take by mouth.    . SUMAtriptan (IMITREX) 100 MG tablet Take by mouth.    Marland Kitchen VYVANSE 70 MG capsule TAKE 1 CAPSULE BY MOUTH IN THE MORNING ONCE DAILY FOR 30 DAYS  0  . zolpidem (AMBIEN) 10 MG tablet Take by mouth.     No current facility-administered medications for this visit.    Review of Systems  Constitutional: Positive for chills (at night), diaphoresis (drenching night sweats since 2019) and malaise/fatigue. Negative for  fever and weight loss (up 9 lbs).       Patient busy working in laptop upon entry to room. Feels "busy".  HENT: Negative for congestion and sore throat.        Dry mouth.  Eyes: Positive for blurred vision and photophobia. Negative for double vision.  Respiratory: Negative for cough and shortness of breath.   Cardiovascular: Negative for chest pain, palpitations and leg swelling.  Gastrointestinal: Positive for heartburn and nausea (Depending on food intake). Negative for constipation, diarrhea and vomiting.       Ice  pica.  Genitourinary: Positive for frequency. Negative for urgency.       Dark/brown urine after IV iron.  Musculoskeletal: Positive for myalgias (foot cramping). Negative for back pain and falls.  Skin: Negative for rash.  Neurological: Positive for dizziness (lightheaded, multiple syncopal episodes over the past 2 years) and headaches (frequent). Negative for sensory change and weakness.       Reports restless legs  Endo/Heme/Allergies: Does not bruise/bleed easily.       Always cold  Psychiatric/Behavioral: Negative for depression. The patient is not nervous/anxious.   All other systems reviewed and are negative.  Performance status (ECOG): 1-2  Vitals Blood pressure 121/77, pulse 94, temperature 98 F (36.7 C), temperature source Tympanic, weight 145 lb 8.1 oz (66 kg), SpO2 100 %.   Physical Exam Vitals and nursing note reviewed.  Constitutional:      General: She is not in acute distress.    Appearance: Normal appearance. She is well-developed.     Interventions: Face mask in place.     Comments: Patient on laptop working upon entering room.  HENT:     Head: Normocephalic and atraumatic.     Comments: Long brown hair.    Mouth/Throat:     Mouth: Mucous membranes are moist. No oral lesions.  Eyes:     Conjunctiva/sclera: Conjunctivae normal.     Pupils: Pupils are equal, round, and reactive to light.     Comments: Brown eyes.  Cardiovascular:     Rate and Rhythm: Normal rate and regular rhythm.     Heart sounds: Normal heart sounds. No murmur heard.  No friction rub. No gallop.   Pulmonary:     Effort: Pulmonary effort is normal. No respiratory distress.     Breath sounds: Normal breath sounds. No wheezing, rhonchi or rales.  Abdominal:     General: Bowel sounds are normal.     Palpations: Abdomen is soft. There is no hepatomegaly, splenomegaly or mass.     Tenderness: There is no abdominal tenderness. There is no guarding or rebound.  Musculoskeletal:         General: No tenderness. Normal range of motion.     Cervical back: Normal range of motion and neck supple.  Lymphadenopathy:     Head:     Right side of head: No preauricular, posterior auricular or occipital adenopathy.     Left side of head: No preauricular, posterior auricular or occipital adenopathy.     Cervical: No cervical adenopathy.     Upper Body:     Right upper body: No supraclavicular or axillary adenopathy.     Left upper body: No supraclavicular or axillary adenopathy.     Lower Body: No right inguinal adenopathy. No left inguinal adenopathy.  Skin:    General: Skin is warm and dry.     Findings: No bruising, erythema, lesion or rash.  Neurological:     Mental Status: She  is alert and oriented to person, place, and time.  Psychiatric:        Behavior: Behavior normal.        Thought Content: Thought content normal.        Judgment: Judgment normal.    No visits with results within 3 Day(s) from this visit.  Latest known visit with results is:  Appointment on 10/18/2019  Component Date Value Ref Range Status  . Preg Test, Ur 10/18/2019 NEGATIVE  NEGATIVE Final   Performed at [facility], [address] [place], [place] [address]  . Folate 10/18/2019 18.1  >5.9 ng/mL Final   Performed at [facility], [address] [place], [place] [address]  . Vitamin B-12 10/18/2019 >7,500* 180 - 914 pg/mL Final   Performed at [facility], [address] [place], [place] [address]  . WBC 10/18/2019 4.4  4.0 - 10.5 K/uL Final  . RBC 10/18/2019 4.37  3.87 - 5.11 MIL/uL Final  . Hemoglobin 10/18/2019 9.5* 12.0 - 15.0 g/dL Final  . HCT 91/47/829509/14/2021 30.8* 36 - 46 % Final  . MCV 10/18/2019 70.5* 80.0 - 100.0 fL Final  . MCH 10/18/2019 21.7* 26.0 - 34.0 pg Final  . MCHC 10/18/2019 30.8  30.0 - 36.0 g/dL Final  . RDW 62/13/086509/14/2021 16.1* 11.5 - 15.5 % Final  . Platelets 10/18/2019 301  150 - 400 K/uL Final  . nRBC 10/18/2019 0.0  0.0 - 0.2 % Final  .  Neutrophils Relative % 10/18/2019 41  % Final  . Neutro Abs 10/18/2019 1.8  1.7 - 7.7 K/uL Final  . Lymphocytes Relative 10/18/2019 49  % Final  . Lymphs Abs 10/18/2019 2.2  0.7 - 4.0 K/uL Final  . Monocytes Relative 10/18/2019 8  % Final  . Monocytes Absolute 10/18/2019 0.3  0 - 1 K/uL Final  . Eosinophils Relative 10/18/2019 1  % Final  . Eosinophils Absolute 10/18/2019 0.0  0 - 0 K/uL Final  . Basophils Relative 10/18/2019 1  % Final  . Basophils Absolute 10/18/2019 0.0  0 - 0 K/uL Final  . Immature Granulocytes 10/18/2019 0  % Final  . Abs Immature Granulocytes 10/18/2019 0.01  0.00 - 0.07 K/uL Final   Performed at [facility], [address] [place], [place] [address]    Assessment:  Roberto ScalesHolly Dwyer is a 44 y.o. female s/p gastric bypass surgery and possible celiac disease.  She is intolerant of oral iron.  Labs on 10/07/2019 revealed a hematocrit 29.9, hemoglobin 8.9, MCV 73.1, platelets 266,000, WBC 5400.  Ferritin was 3. Iron saturation was 10% with a TIBC 406.2.  Vitamin B12 was 131.  B12 was > 7500 and folate 18.1 on 10/18/2019.  She has iron deficiency.  Ferritin was 3 on 10/07/2019.  She received Venofer on 10/07/2019.  EGD in 2019 revealed a hiatal hernia, mildly dilated esophagus and dysmotility esophagus.  There were fundic gland polyps.  Duodenal biopsies revealed some increase in epithelial lymphocytes.  Colonoscopy on 04/22/2019 revealed only hemorrhoids.  She is on a gluten-free diet.  Follow-up EGD is scheduled.  She has B12 deficiency.  B12 was 131.  She received B12 weekly x 4 (last 10/18/2019).  Symptomatically, she is remains tired.  She notes change in urine color for a few days after her infusion.  Exam is stable.  Plan: 1.   Iron deficiency  Patient has iron deficiency anemia likely secondary to gastrojejunostomy and possible celiac disease.  She describes anemia prior to her surgeries.             EGD and colonoscopy revealed no  evidence of blood loss.             She is intolerant of oral iron.             She received Venofer on 10/07/2019.  Urine pregnancy test required prior to infusions. 2.   B12 deficiency  Patient has B12 deficiency likely secondary to gastrojejunostomy.             B12 was 131 on 10/07/2019 and > 7500 on 10/18/2019 (after injection).             B12 goal is 400.             She has received B12 weekly x 4 (last 10/18/2019).             Begin B12 monthly on 11/15/2019.  No B12 today. 3.   Week #2 Venofer after urine pregnancy test. 4.   RTC weekly x 2 for Venofer + urine pregnancy test. 5.   RTC in 6 weeks for labs (CBC with diff, ferritin). 5.   RTC in 3 months for MD assessment, labs (CBC with diff, ferritin, iron studies- day before) and +/- Venofer.  I discussed the assessment and treatment plan with the patient.  The patient was provided an opportunity to ask questions and all were answered.  The patient agreed with the plan and demonstrated an understanding of the instructions.  The patient was advised to call back if the symptoms worsen or if the condition fails to improve as anticipated.   Rosey Bath, MD, PhD    10/31/2019, 2:24 PM  I, Theador Hawthorne, am acting as scribe for [employer]. Merlene Pulling, MD, PhD.  I, Leona Alen C. Merlene Pulling, MD, have reviewed the above documentation for accuracy and completeness, and I agree with the above.

## 2019-10-31 ENCOUNTER — Other Ambulatory Visit: Payer: Self-pay | Admitting: Hematology and Oncology

## 2019-10-31 ENCOUNTER — Ambulatory Visit: Payer: Federal, State, Local not specified - PPO

## 2019-10-31 ENCOUNTER — Inpatient Hospital Stay (HOSPITAL_BASED_OUTPATIENT_CLINIC_OR_DEPARTMENT_OTHER): Payer: Federal, State, Local not specified - PPO | Admitting: Hematology and Oncology

## 2019-10-31 ENCOUNTER — Encounter: Payer: Self-pay | Admitting: Hematology and Oncology

## 2019-10-31 ENCOUNTER — Other Ambulatory Visit: Payer: Self-pay

## 2019-10-31 VITALS — BP 121/77 | HR 94 | Temp 98.0°F | Wt 145.5 lb

## 2019-10-31 VITALS — BP 112/72 | HR 77 | Resp 16

## 2019-10-31 DIAGNOSIS — D509 Iron deficiency anemia, unspecified: Secondary | ICD-10-CM

## 2019-10-31 DIAGNOSIS — K9589 Other complications of other bariatric procedure: Secondary | ICD-10-CM

## 2019-10-31 DIAGNOSIS — D508 Other iron deficiency anemias: Secondary | ICD-10-CM

## 2019-10-31 DIAGNOSIS — E538 Deficiency of other specified B group vitamins: Secondary | ICD-10-CM | POA: Diagnosis not present

## 2019-10-31 LAB — PREGNANCY, URINE: Preg Test, Ur: NEGATIVE

## 2019-10-31 MED ORDER — SODIUM CHLORIDE 0.9 % IV SOLN: 200.0000 mg | Freq: Once | INTRAVENOUS | Status: DC

## 2019-10-31 MED ORDER — SODIUM CHLORIDE 0.9 % IV SOLN
Freq: Once | INTRAVENOUS | Status: AC
  Filled 2019-10-31: qty 250

## 2019-10-31 MED ORDER — IRON SUCROSE 20 MG/ML IV SOLN
200.0000 mg | Freq: Once | INTRAVENOUS | Status: AC
  Administered 2019-10-31: 200 mg via INTRAVENOUS
  Filled 2019-10-31: qty 10

## 2019-10-31 NOTE — Progress Notes (Signed)
No new changes noted today 

## 2019-11-03 ENCOUNTER — Other Ambulatory Visit
Admission: RE | Admit: 2019-11-03 | Discharge: 2019-11-03 | Disposition: A | Discharge status: Home / Self Care | Payer: Federal, State, Local not specified - PPO | Source: Ambulatory Visit | Attending: Gastroenterology | Admitting: Gastroenterology

## 2019-11-03 ENCOUNTER — Other Ambulatory Visit: Payer: Self-pay

## 2019-11-03 DIAGNOSIS — Z01812 Encounter for preprocedural laboratory examination: Secondary | ICD-10-CM | POA: Diagnosis not present

## 2019-11-03 DIAGNOSIS — Z20822 Contact with and (suspected) exposure to covid-19: Secondary | ICD-10-CM | POA: Diagnosis not present

## 2019-11-03 LAB — SARS CORONAVIRUS 2 (TAT 6-24 HRS): SARS Coronavirus 2: NEGATIVE

## 2019-11-07 ENCOUNTER — Inpatient Hospital Stay: Payer: Federal, State, Local not specified - PPO

## 2019-11-07 ENCOUNTER — Ambulatory Visit: Payer: Federal, State, Local not specified - PPO | Admitting: Certified Registered"

## 2019-11-07 ENCOUNTER — Ambulatory Visit
Admission: RE | Admit: 2019-11-07 | Discharge: 2019-11-07 | Disposition: A | Discharge status: Home / Self Care | Payer: Federal, State, Local not specified - PPO | Attending: Gastroenterology | Admitting: Gastroenterology

## 2019-11-07 ENCOUNTER — Encounter
Admission: RE | Disposition: A | Discharge status: Home / Self Care | Payer: Self-pay | Source: Home / Self Care | Attending: Gastroenterology

## 2019-11-07 ENCOUNTER — Encounter: Payer: Self-pay | Admitting: *Deleted

## 2019-11-07 DIAGNOSIS — R768 Other specified abnormal immunological findings in serum: Secondary | ICD-10-CM | POA: Diagnosis present

## 2019-11-07 DIAGNOSIS — Z79899 Other long term (current) drug therapy: Secondary | ICD-10-CM | POA: Insufficient documentation

## 2019-11-07 DIAGNOSIS — K2289 Other specified disease of esophagus: Secondary | ICD-10-CM | POA: Diagnosis not present

## 2019-11-07 DIAGNOSIS — K219 Gastro-esophageal reflux disease without esophagitis: Secondary | ICD-10-CM | POA: Insufficient documentation

## 2019-11-07 DIAGNOSIS — K449 Diaphragmatic hernia without obstruction or gangrene: Secondary | ICD-10-CM | POA: Insufficient documentation

## 2019-11-07 DIAGNOSIS — Z980 Intestinal bypass and anastomosis status: Secondary | ICD-10-CM | POA: Insufficient documentation

## 2019-11-07 HISTORY — DX: Iron deficiency: E61.1

## 2019-11-07 HISTORY — DX: Deficiency of other specified B group vitamins: E53.8

## 2019-11-07 HISTORY — DX: Simple febrile convulsions: R56.00

## 2019-11-07 HISTORY — DX: Anemia, unspecified: D64.9

## 2019-11-07 HISTORY — PX: ESOPHAGOGASTRODUODENOSCOPY (EGD) WITH PROPOFOL: SHX5813

## 2019-11-07 LAB — POCT PREGNANCY, URINE: Preg Test, Ur: NEGATIVE

## 2019-11-07 SURGERY — ESOPHAGOGASTRODUODENOSCOPY (EGD) WITH PROPOFOL
Anesthesia: General

## 2019-11-07 MED ORDER — PROPOFOL 10 MG/ML IV BOLUS
INTRAVENOUS | Status: AC
  Filled 2019-11-07: qty 40

## 2019-11-07 MED ORDER — SODIUM CHLORIDE 0.9 % IV SOLN: INTRAVENOUS | Status: DC

## 2019-11-07 MED ORDER — PROPOFOL 500 MG/50ML IV EMUL
INTRAVENOUS | Status: DC | PRN
  Administered 2019-11-07: 150 ug/kg/min via INTRAVENOUS

## 2019-11-07 MED ORDER — PROPOFOL 10 MG/ML IV BOLUS
INTRAVENOUS | Status: DC | PRN
  Administered 2019-11-07: 20 mg via INTRAVENOUS
  Administered 2019-11-07: 80 mg via INTRAVENOUS
  Administered 2019-11-07: 20 mg via INTRAVENOUS

## 2019-11-07 MED ORDER — LIDOCAINE HCL (PF) 2 % IJ SOLN
INTRAMUSCULAR | Status: AC
  Filled 2019-11-07: qty 5

## 2019-11-07 NOTE — Anesthesia Postprocedure Evaluation (Signed)
Anesthesia Post Note  Patient: Nichole Gentry  Procedure(s) Performed: ESOPHAGOGASTRODUODENOSCOPY (EGD) WITH PROPOFOL (N/A )  Patient location during evaluation: Endoscopy Anesthesia Type: General Level of consciousness: awake and alert and oriented Pain management: pain level controlled Vital Signs Assessment: post-procedure vital signs reviewed and stable Respiratory status: spontaneous breathing, nonlabored ventilation and respiratory function stable Cardiovascular status: blood pressure returned to baseline and stable Postop Assessment: no signs of nausea or vomiting Anesthetic complications: no   No complications documented.   Last Vitals:  Vitals:   11/07/19 1238 11/07/19 1248  BP: 96/65 92/63  Pulse:    Resp:    Temp:    SpO2:      Last Pain:  Vitals:   11/07/19 1248  TempSrc:   PainSc: 0-No pain                 Josha Weekley

## 2019-11-07 NOTE — Anesthesia Preprocedure Evaluation (Signed)
Anesthesia Evaluation  Patient identified by MRN, date of birth, ID band Patient awake    Reviewed: Allergy & Precautions, NPO status , Patient's Chart, lab work & pertinent test results  History of Anesthesia Complications Negative for: history of anesthetic complications  Airway Mallampati: II  TM Distance: >3 FB Neck ROM: Full    Dental no notable dental hx.    Pulmonary neg pulmonary ROS, neg sleep apnea, neg COPD,    breath sounds clear to auscultation- rhonchi (-) wheezing      Cardiovascular Exercise Tolerance: Good (-) hypertension(-) CAD, (-) Past MI, (-) Cardiac Stents and (-) CABG  Rhythm:Regular Rate:Normal - Systolic murmurs and - Diastolic murmurs    Neuro/Psych  Headaches, neg Seizures PSYCHIATRIC DISORDERS    GI/Hepatic Neg liver ROS, GERD  ,  Endo/Other  negative endocrine ROSneg diabetes  Renal/GU negative Renal ROS     Musculoskeletal negative musculoskeletal ROS (+)   Abdominal (+) - obese,   Peds  Hematology  (+) anemia ,   Anesthesia Other Findings Past Medical History: No date: ADD (attention deficit disorder) No date: Anemia No date: B12 deficiency No date: GERD (gastroesophageal reflux disease) No date: Low iron No date: Migraine No date: Simple febrile convulsions (HCC)   Reproductive/Obstetrics                             Anesthesia Physical Anesthesia Plan  ASA: II  Anesthesia Plan: General   Post-op Pain Management:    Induction: Intravenous  PONV Risk Score and Plan: 2 and Propofol infusion  Airway Management Planned: Natural Airway  Additional Equipment:   Intra-op Plan:   Post-operative Plan:   Informed Consent: I have reviewed the patients History and Physical, chart, labs and discussed the procedure including the risks, benefits and alternatives for the proposed anesthesia with the patient or authorized representative who has indicated  his/her understanding and acceptance.     Dental advisory given  Plan Discussed with: CRNA and Anesthesiologist  Anesthesia Plan Comments:         Anesthesia Quick Evaluation

## 2019-11-07 NOTE — H&P (Signed)
Outpatient short stay form Pre-procedure 11/07/2019 11:53 AM Merlyn Lot MD, MPH  Primary Physician: Dr. 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 Has been eating bread for past two weeks with significant pain and bloating. No blood thinners. No family history of GI malignancies but family history of H. Pylori.   Current Facility-Administered Medications:  .  0.9 %  sodium chloride infusion, , Intravenous, Continuous, Roselia Snipe, Rossie Muskrat, MD, Last Rate: 20 mL/hr at 11/07/19 1112, New Bag at 11/07/19 1112  Medications Prior to Admission  Medication Sig Dispense Refill Last Dose  . Multiple Vitamin (MULTI-VITAMINS) TABS Take by mouth.    11/06/2019 at Unknown time  . pantoprazole (PROTONIX) 40 MG tablet Take by mouth.   11/06/2019 at Unknown time  . VYVANSE 70 MG capsule TAKE 1 CAPSULE BY MOUTH IN THE MORNING ONCE DAILY FOR 30 DAYS  0 11/06/2019 at Unknown time  . zolpidem (AMBIEN) 10 MG tablet Take by mouth.   11/06/2019 at Unknown time  . amoxicillin (AMOXIL) 875 MG tablet Take 1 tablet (875 mg total) by mouth 2 (two) times daily. (Patient not taking: Reported on 10/17/2019) 20 tablet 0 Not Taking at Unknown time  . amphetamine-dextroamphetamine (ADDERALL) 10 MG tablet Take by mouth. (Patient not taking: Reported on 11/07/2019)   Not Taking at Unknown time  . Butalbital-APAP-Caffeine 50-325-40 MG capsule Take by mouth.  (Patient not taking: Reported on 11/07/2019)   Not Taking at Unknown time  . meloxicam (MOBIC) 7.5 MG tablet Take 7.5 mg by mouth daily. (Patient not taking: Reported on 11/07/2019)   Not Taking at Unknown time  . metroNIDAZOLE (FLAGYL) 500 MG tablet SMARTSIG:1 Tablet(s) By Mouth Every 12 Hours (Patient not taking: Reported on 10/31/2019)   Not Taking at Unknown time  . ondansetron (ZOFRAN-ODT) 4 MG  disintegrating tablet DISSOLVE 1 TABLET IN MOUTH THREE TIMES DAILY AS NEEDED (Patient not taking: Reported on 10/17/2019)   Not Taking at Unknown time  . SUMAtriptan (IMITREX) 100 MG tablet Take by mouth. (Patient not taking: Reported on 11/07/2019)   Not Taking at Unknown time     No Known Allergies   Past Medical History:  Diagnosis Date  . ADD (attention deficit disorder)   . Anemia   . B12 deficiency   . GERD (gastroesophageal reflux disease)   . Low iron   . Migraine   . Simple febrile convulsions (HCC)     Review of systems:  Otherwise negative.    Physical Exam  Gen: Alert, oriented. Appears stated age.  HEENT: [place]/AT. PERRLA. Lungs: No respiratory distress Abd: soft, benign, no masses.  Ext: No edema.     Planned procedures: Proceed with EGD. The patient understands the nature of the planned procedure, indications, risks, alternatives and potential complications including but not limited to bleeding, infection, perforation, damage to internal organs and possible oversedation/side effects from anesthesia. The patient agrees and gives consent to proceed.  Please refer to procedure notes for findings, recommendations and patient disposition/instructions.     Merlyn Lot MD, MPH Gastroenterology 11/07/2019  11:53 AM

## 2019-11-07 NOTE — Anesthesia Procedure Notes (Signed)
Date/Time: 11/07/2019 2:57 PM Performed by: Stormy Fabian, CRNA Pre-anesthesia Checklist: Patient identified, Emergency Drugs available, Suction available and Patient being monitored Patient Re-evaluated:Patient Re-evaluated prior to induction Oxygen Delivery Method: Nasal cannula Induction Type: IV induction Dental Injury: Teeth and Oropharynx as per pre-operative assessment  Comments: Nasal cannula with etCO2 monitoring

## 2019-11-07 NOTE — Transfer of Care (Signed)
Immediate Anesthesia Transfer of Care Note  Patient: Nichole Gentry  Procedure(s) Performed: ESOPHAGOGASTRODUODENOSCOPY (EGD) WITH PROPOFOL (N/A )  Patient Location: PACU  Anesthesia Type:MAC  Level of Consciousness: drowsy  Airway & Oxygen Therapy: Patient Spontanous Breathing  Post-op Assessment: Report given to RN and Post -op Vital signs reviewed and stable  Post vital signs: stable  Last Vitals:  Vitals Value Taken Time  BP 106/68 11/07/19 1218  Temp 36.2 C 11/07/19 1218  Pulse 76 11/07/19 1220  Resp 20 11/07/19 1220  SpO2 95 % 11/07/19 1220  Vitals shown include unvalidated device data.  Last Pain:  Vitals:   11/07/19 1218  TempSrc: Temporal  PainSc: Asleep         Complications: No complications documented.

## 2019-11-07 NOTE — Interval H&P Note (Signed)
History and Physical Interval Note:  11/07/2019 11:56 AM  Nichole Gentry  has presented today for surgery, with the diagnosis of GERD.  The various methods of treatment have been discussed with the patient and family. After consideration of risks, benefits and other options for treatment, the patient has consented to  Procedure(s): ESOPHAGOGASTRODUODENOSCOPY (EGD) WITH PROPOFOL (N/A) as a surgical intervention.  The patient's history has been reviewed, patient examined, no change in status, stable for surgery.  I have reviewed the patient's chart and labs.  Questions were answered to the patient's satisfaction.     Regis Bill  Ok to proceed with EGD.

## 2019-11-07 NOTE — Op Note (Signed)
[facility]enter[facility] Patient Name: Nichole Gentry Procedure Date: 11/07/2019 11:43 AM MRN: [id number] Account #: [id number] Date of Birth: [birth year] Admit Type: Outpatient Age: 44 Room: [facility] ENDO ROOM 1 Gender: Female Note Status: Finalized Procedure:             Upper GI endoscopy Indications:           Gastro-esophageal reflux disease, Positive celiac                         serologies Providers:             Andrey Farmer MD, MD Referring MD:          Meindert A. Brunetta Genera, MD (Referring MD) Medicines:             Monitored Anesthesia Care Complications:         No immediate complications. Estimated blood loss:                         Minimal. Procedure:             Pre-Anesthesia Assessment:                        - Prior to the procedure, a History and Physical was                         performed, and patient medications and allergies were                         reviewed. The patient is competent. The risks and                         benefits of the procedure and the sedation options and                         risks were discussed with the patient. All questions                         were answered and informed consent was obtained.                         Patient identification and proposed procedure were                         verified by the physician, the nurse, the anesthetist                         and the technician in the endoscopy suite. Mental                         Status Examination: alert and oriented. Airway                         Examination: normal oropharyngeal airway and neck                         mobility. Respiratory Examination: clear to                         auscultation.  CV Examination: normal. Prophylactic                         Antibiotics: The patient does not require prophylactic                         antibiotics. Prior Anticoagulants: The patient has                         taken no previous anticoagulant  or antiplatelet                         agents. ASA Grade Assessment: II - A patient with mild                         systemic disease. After reviewing the risks and                         benefits, the patient was deemed in satisfactory                         condition to undergo the procedure. The anesthesia                         plan was to use monitored anesthesia care (MAC).                         Immediately prior to administration of medications,                         the patient was re-assessed for adequacy to receive                         sedatives. The heart rate, respiratory rate, oxygen                         saturations, blood pressure, adequacy of pulmonary                         ventilation, and response to care were monitored                         throughout the procedure. The physical status of the                         patient was re-assessed after the procedure.                        After obtaining informed consent, the endoscope was                         passed under direct vision. Throughout the procedure,                         the patient's blood pressure, pulse, and oxygen                         saturations were monitored continuously. The Endoscope  was introduced through the mouth, and advanced to the                         jejunum. The upper GI endoscopy was accomplished                         without difficulty. The patient tolerated the                         procedure well. Findings:      One tongue of salmon-colored mucosa was present. Biopsies were taken       with a cold forceps for histology. Estimated blood loss was minimal.      A small hiatal hernia was present.      Evidence of a Roux-en-Y gastrojejunostomy was found. The gastrojejunal       anastomosis was characterized by healthy appearing mucosa. The       jejunojejunal anastomosis was characterized by healthy appearing mucosa.       Biopsies were  taken with a cold forceps for Helicobacter pylori testing.      The examined jejunum was normal. Biopsies for histology were taken with       a cold forceps for evaluation of celiac disease. Estimated blood loss       was minimal. Impression:            - Salmon-colored mucosa suspicious for short-segment                         Barrett's esophagus. Biopsied.                        - Small hiatal hernia.                        - Roux-en-Y gastrojejunostomy with gastrojejunal                         anastomosis characterized by healthy appearing mucosa.                        - Normal examined jejunum. Biopsied. Recommendation:        - Discharge patient to home.                        - Resume previous diet.                        - Continue present medications.                        - Await pathology results.                        - Return to referring physician as previously                         scheduled. Procedure Code(s):     --- Professional ---                        [phone], Esophagogastroduodenoscopy, flexible,  transoral; with biopsy, single or multiple Diagnosis Code(s):     --- Professional ---                        K22.8, Other specified diseases of esophagus                        K44.9, Diaphragmatic hernia without obstruction or                         gangrene                        Z98.0, Intestinal bypass and anastomosis status                        K21.9, Gastro-esophageal reflux disease without                         esophagitis                        R76.8, Other specified abnormal immunological findings                         in serum CPT copyright 2019 [employer]. All rights reserved. The codes documented in this report are preliminary and upon coder review may  be revised to meet current compliance requirements. Andrey Farmer, MD Andrey Farmer MD, MD 11/07/2019 12:20:11 PM Number of Addenda: 0 Note  Initiated On: 11/07/2019 11:43 AM Estimated Blood Loss:  Estimated blood loss was minimal.      [facility]

## 2019-11-08 ENCOUNTER — Inpatient Hospital Stay: Payer: Federal, State, Local not specified - PPO

## 2019-11-08 ENCOUNTER — Encounter: Payer: Self-pay | Admitting: Gastroenterology

## 2019-11-08 ENCOUNTER — Other Ambulatory Visit: Payer: Self-pay

## 2019-11-08 ENCOUNTER — Inpatient Hospital Stay: Payer: Federal, State, Local not specified - PPO | Attending: Hematology and Oncology

## 2019-11-08 VITALS — BP 103/69 | HR 90 | Resp 18

## 2019-11-08 DIAGNOSIS — K9589 Other complications of other bariatric procedure: Secondary | ICD-10-CM

## 2019-11-08 DIAGNOSIS — D500 Iron deficiency anemia secondary to blood loss (chronic): Secondary | ICD-10-CM | POA: Insufficient documentation

## 2019-11-08 DIAGNOSIS — Z79899 Other long term (current) drug therapy: Secondary | ICD-10-CM | POA: Diagnosis not present

## 2019-11-08 DIAGNOSIS — D508 Other iron deficiency anemias: Secondary | ICD-10-CM

## 2019-11-08 LAB — SURGICAL PATHOLOGY

## 2019-11-08 MED ORDER — IRON SUCROSE 20 MG/ML IV SOLN
200.0000 mg | Freq: Once | INTRAVENOUS | Status: AC
  Administered 2019-11-08: 200 mg via INTRAVENOUS
  Filled 2019-11-08: qty 10

## 2019-11-08 MED ORDER — SODIUM CHLORIDE 0.9 % IV SOLN: 200.0000 mg | Freq: Once | INTRAVENOUS | Status: DC

## 2019-11-08 MED ORDER — SODIUM CHLORIDE 0.9 % IV SOLN
Freq: Once | INTRAVENOUS | Status: AC
  Filled 2019-11-08: qty 250

## 2019-11-14 ENCOUNTER — Inpatient Hospital Stay: Payer: Federal, State, Local not specified - PPO

## 2019-11-14 ENCOUNTER — Other Ambulatory Visit: Payer: Self-pay

## 2019-11-14 VITALS — BP 112/64 | HR 90 | Resp 18

## 2019-11-14 DIAGNOSIS — K9589 Other complications of other bariatric procedure: Secondary | ICD-10-CM

## 2019-11-14 DIAGNOSIS — D508 Other iron deficiency anemias: Secondary | ICD-10-CM

## 2019-11-14 DIAGNOSIS — D500 Iron deficiency anemia secondary to blood loss (chronic): Secondary | ICD-10-CM | POA: Diagnosis not present

## 2019-11-14 LAB — PREGNANCY, URINE: Preg Test, Ur: NEGATIVE

## 2019-11-14 MED ORDER — IRON SUCROSE 20 MG/ML IV SOLN
200.0000 mg | Freq: Once | INTRAVENOUS | Status: AC
  Administered 2019-11-14: 200 mg via INTRAVENOUS
  Filled 2019-11-14: qty 10

## 2019-11-14 MED ORDER — SODIUM CHLORIDE 0.9 % IV SOLN: 200.0000 mg | Freq: Once | INTRAVENOUS | Status: DC

## 2019-11-14 MED ORDER — SODIUM CHLORIDE 0.9 % IV SOLN
Freq: Once | INTRAVENOUS | Status: AC
  Filled 2019-11-14: qty 250

## 2019-12-12 ENCOUNTER — Other Ambulatory Visit: Payer: Federal, State, Local not specified - PPO

## 2019-12-12 ENCOUNTER — Inpatient Hospital Stay: Payer: Federal, State, Local not specified - PPO | Attending: Hematology and Oncology

## 2019-12-12 ENCOUNTER — Other Ambulatory Visit: Payer: Self-pay

## 2019-12-12 DIAGNOSIS — D509 Iron deficiency anemia, unspecified: Secondary | ICD-10-CM | POA: Diagnosis not present

## 2019-12-12 DIAGNOSIS — Z9884 Bariatric surgery status: Secondary | ICD-10-CM | POA: Diagnosis not present

## 2019-12-12 DIAGNOSIS — Z79899 Other long term (current) drug therapy: Secondary | ICD-10-CM | POA: Insufficient documentation

## 2019-12-12 DIAGNOSIS — D508 Other iron deficiency anemias: Secondary | ICD-10-CM

## 2019-12-12 DIAGNOSIS — K9589 Other complications of other bariatric procedure: Secondary | ICD-10-CM

## 2019-12-12 LAB — CBC WITH DIFFERENTIAL/PLATELET
Abs Immature Granulocytes: 0.02 10*3/uL (ref 0.00–0.07)
Basophils Absolute: 0 10*3/uL (ref 0.0–0.1)
Basophils Relative: 1 %
Eosinophils Absolute: 0.1 10*3/uL (ref 0.0–0.5)
Eosinophils Relative: 2 %
HCT: 36.4 % (ref 36.0–46.0)
Hemoglobin: 11.9 g/dL — ABNORMAL LOW (ref 12.0–15.0)
Immature Granulocytes: 0 %
Lymphocytes Relative: 40 %
Lymphs Abs: 2.4 10*3/uL (ref 0.7–4.0)
MCH: 25.9 pg — ABNORMAL LOW (ref 26.0–34.0)
MCHC: 32.7 g/dL (ref 30.0–36.0)
MCV: 79.1 fL — ABNORMAL LOW (ref 80.0–100.0)
Monocytes Absolute: 0.5 10*3/uL (ref 0.1–1.0)
Monocytes Relative: 8 %
Neutro Abs: 3 10*3/uL (ref 1.7–7.7)
Neutrophils Relative %: 49 %
Platelet Morphology: NORMAL
Platelets: 257 10*3/uL (ref 150–400)
RBC Morphology: NONE SEEN
RBC: 4.6 MIL/uL (ref 3.87–5.11)
WBC: 6 10*3/uL (ref 4.0–10.5)
nRBC: 0 % (ref 0.0–0.2)

## 2019-12-12 LAB — FERRITIN: Ferritin: 20 ng/mL (ref 11–307)

## 2019-12-13 ENCOUNTER — Telehealth: Payer: Self-pay

## 2019-12-13 ENCOUNTER — Other Ambulatory Visit: Payer: Self-pay

## 2019-12-13 NOTE — Telephone Encounter (Signed)
Spoke with the patient to inform of her labs results. The patient has been c/o numbness and blue hands with nausea and dizziness. Per Dr Merlene Pulling the patient need to be seen in [facility]. The patient was agreeable and she has been scheduled.

## 2019-12-13 NOTE — Telephone Encounter (Signed)
-----   Message from Rosey Bath, MD sent at 12/12/2019  4:54 PM EST ----- Regarding: Please call patient  Hemoglobin 11.9 (slightly low).  MCV 79.1 (low)  Ferritin 20.  Overall better, but still iron deficient.  Recommend Venofer weekly x 2 with urine pregnancy test.  M ----- Message ----- From: Interface, Lab In [place] Sent: 12/12/2019  12:14 PM EST To: Rosey Bath, MD

## 2019-12-14 ENCOUNTER — Inpatient Hospital Stay: Payer: Federal, State, Local not specified - PPO | Admitting: Oncology

## 2019-12-15 ENCOUNTER — Inpatient Hospital Stay: Payer: Federal, State, Local not specified - PPO

## 2019-12-15 ENCOUNTER — Other Ambulatory Visit: Payer: Self-pay

## 2019-12-15 VITALS — BP 110/72 | HR 88 | Resp 18

## 2019-12-15 DIAGNOSIS — K9589 Other complications of other bariatric procedure: Secondary | ICD-10-CM

## 2019-12-15 DIAGNOSIS — D509 Iron deficiency anemia, unspecified: Secondary | ICD-10-CM | POA: Diagnosis not present

## 2019-12-15 DIAGNOSIS — D508 Other iron deficiency anemias: Secondary | ICD-10-CM

## 2019-12-15 LAB — PREGNANCY, URINE: Preg Test, Ur: NEGATIVE

## 2019-12-15 MED ORDER — HEPARIN SOD (PORK) LOCK FLUSH 100 UNIT/ML IV SOLN
250.0000 [IU] | Freq: Once | INTRAVENOUS | Status: DC | PRN
  Filled 2019-12-15: qty 5

## 2019-12-15 MED ORDER — SODIUM CHLORIDE 0.9% FLUSH
10.0000 mL | Freq: Once | INTRAVENOUS | Status: DC | PRN
  Filled 2019-12-15: qty 10

## 2019-12-15 MED ORDER — SODIUM CHLORIDE 0.9% FLUSH
3.0000 mL | Freq: Once | INTRAVENOUS | Status: DC | PRN
  Filled 2019-12-15: qty 3

## 2019-12-15 MED ORDER — IRON SUCROSE 20 MG/ML IV SOLN
200.0000 mg | Freq: Once | INTRAVENOUS | Status: AC
  Administered 2019-12-15: 200 mg via INTRAVENOUS
  Filled 2019-12-15: qty 10

## 2019-12-15 MED ORDER — SODIUM CHLORIDE 0.9 % IV SOLN
Freq: Once | INTRAVENOUS | Status: AC
  Filled 2019-12-15: qty 250

## 2019-12-15 MED ORDER — HEPARIN SOD (PORK) LOCK FLUSH 100 UNIT/ML IV SOLN
500.0000 [IU] | Freq: Once | INTRAVENOUS | Status: DC | PRN
  Filled 2019-12-15: qty 5

## 2019-12-15 MED ORDER — SODIUM CHLORIDE 0.9 % IV SOLN: 200.0000 mg | Freq: Once | INTRAVENOUS | Status: DC

## 2019-12-15 MED ORDER — ALTEPLASE 2 MG IJ SOLR
2.0000 mg | Freq: Once | INTRAMUSCULAR | Status: DC | PRN
  Filled 2019-12-15: qty 2

## 2019-12-15 NOTE — Progress Notes (Signed)
Pt tolerated iron infusion without difficulty, VVS, d/ced home

## 2019-12-21 ENCOUNTER — Inpatient Hospital Stay: Payer: Federal, State, Local not specified - PPO

## 2019-12-22 ENCOUNTER — Inpatient Hospital Stay: Payer: Federal, State, Local not specified - PPO

## 2019-12-22 ENCOUNTER — Other Ambulatory Visit: Payer: Self-pay

## 2019-12-22 VITALS — BP 115/70 | HR 82 | Temp 96.3°F | Resp 16

## 2019-12-22 DIAGNOSIS — D509 Iron deficiency anemia, unspecified: Secondary | ICD-10-CM | POA: Diagnosis not present

## 2019-12-22 DIAGNOSIS — K9589 Other complications of other bariatric procedure: Secondary | ICD-10-CM

## 2019-12-22 DIAGNOSIS — D508 Other iron deficiency anemias: Secondary | ICD-10-CM

## 2019-12-22 LAB — PREGNANCY, URINE: Preg Test, Ur: NEGATIVE

## 2019-12-22 MED ORDER — SODIUM CHLORIDE 0.9 % IV SOLN: 200.0000 mg | Freq: Once | INTRAVENOUS | Status: DC

## 2019-12-22 MED ORDER — IRON SUCROSE 20 MG/ML IV SOLN
200.0000 mg | Freq: Once | INTRAVENOUS | Status: AC
  Administered 2019-12-22: 200 mg via INTRAVENOUS
  Filled 2019-12-22: qty 10

## 2019-12-22 NOTE — Progress Notes (Signed)
Venofer well tolerated. Vitals stable, discharged in stable condition.

## 2020-02-01 ENCOUNTER — Inpatient Hospital Stay: Payer: Federal, State, Local not specified - PPO

## 2020-02-01 NOTE — Progress Notes (Signed)
[facility]  [address] [place], [place] [address] Phone: [phone]  Fax: [phone]   Clinic Day:  02/02/2020  Referring physician: Evelene Croon, MD  Chief Complaint: Nichole Gentry is a 44 y.o. female s/p gastric bypass surgery with iron deficiency and B12 deficiency who is seen for 3 month assessment.  HPI: The patient was last seen in the hematology clinic on 10/31/2019. At that time, she remained tired.  She noted change in urine color for a few days after her infusion.  Exam was stable.  CBC on 10/18/2019 revealed a hematocrit of 30.8, hemoglobin 9.5, MCV 70.5, platelets 301,000, WBC 4400 (ANC 1800).  She received weekly Venofer x 3 (10/31/2019 - 11/14/2019).  EGD on 11/07/2019 by Dr. Mia Creek revealed salmon-colored mucosa suspicious for short-segment Barrett's esophagus. There was a small hiatal hernia. Roux-en-Y gastrojejunostomy with gastrojejunal anastomosis characterized by healthy appearing mucosa. Stomach and esophagus biopsies showed chronic inflammation. Negative for intestinal metaplasia, dysplasia, and malignancy.  Labs on 12/12/2019 revealed a hematocrit of 36.4, hemoglobin 11.9, platelets 257,000, WBC 6,000. Ferritin was 20.  She received weekly Venofer x 2 (12/15/2019 - 12/22/2019).  During the interim, she has been tired. She is due for B12 but needs syringes. Her urine color is back to normal. Her chills, night sweats, headaches, restless legs, dry mouth, sensitivity to light, blurred vision, heartburn, and nausea are stable. She has been eating less ice. She gets foot cramps when she is dehydrated and uses heating pads. She always feels cold.  The patient has palpitations/flutters every once in a while. She gets dizzy, lightheaded, and short of breath sometimes. She is not interested in a cardiology evaluation.   Past Medical History:  Diagnosis Date  . ADD (attention deficit disorder)   . Anemia   . B12 deficiency    . GERD (gastroesophageal reflux disease)   . Low iron   . Migraine   . Simple febrile convulsions [facility])     Past Surgical History:  Procedure Laterality Date  . ESOPHAGOGASTRODUODENOSCOPY (EGD) WITH PROPOFOL N/A 11/07/2019   Procedure: ESOPHAGOGASTRODUODENOSCOPY (EGD) WITH PROPOFOL;  Surgeon: Regis Bill, MD;  Location: [facility] ENDOSCOPY;  Service: Endoscopy;  Laterality: N/A;  . HIATAL HERNIA REPAIR    . LAPAROSCOPIC REVISION OF ROUXENY WITH UPPER ENDOSCOPY    . linx management system      Family History  Problem Relation Age of Onset  . Thyroid disease Mother   . Diabetes Mother   . Hyperlipidemia Father   . Lung cancer Father   . Brain cancer Father     Social History:  reports that she has never smoked. She has never used smokeless tobacco. She reports current alcohol use. She reports that she does not use drugs. The patient is alone today.  Allergies: No Known Allergies  Current Medications: Current Outpatient Medications  Medication Sig Dispense Refill  . cyanocobalamin (,VITAMIN B-12,) 1000 MCG/ML injection Inject 1,000 mcg into the muscle every 30 (thirty) days.    . Multiple Vitamin (MULTI-VITAMINS) TABS Take by mouth.     . pantoprazole (PROTONIX) 40 MG tablet Take by mouth.    Marland Kitchen VYVANSE 70 MG capsule TAKE 1 CAPSULE BY MOUTH IN THE MORNING ONCE DAILY FOR 30 DAYS  0  . zolpidem (AMBIEN) 10 MG tablet Take by mouth.    Marland Kitchen amoxicillin (AMOXIL) 875 MG tablet Take 1 tablet (875 mg total) by mouth 2 (two) times daily. 20 tablet 0  . amphetamine-dextroamphetamine (ADDERALL) 10 MG tablet Take  by mouth.    . Butalbital-APAP-Caffeine 50-325-40 MG capsule Take by mouth.    Marland Kitchen LORazepam (ATIVAN) 0.5 MG tablet Take 0.5 mg by mouth 2 (two) times daily as needed. (Patient not taking: Reported on 02/02/2020)    . meloxicam (MOBIC) 7.5 MG tablet Take 7.5 mg by mouth daily.    . metroNIDAZOLE (FLAGYL) 500 MG tablet SMARTSIG:1 Tablet(s) By Mouth Every 12 Hours    . ondansetron  (ZOFRAN-ODT) 4 MG disintegrating tablet DISSOLVE 1 TABLET IN MOUTH THREE TIMES DAILY AS NEEDED    . SUMAtriptan (IMITREX) 100 MG tablet Take by mouth.     No current facility-administered medications for this visit.    Review of Systems  Constitutional: Positive for chills (occasional), diaphoresis (occasional), malaise/fatigue and weight loss (3 lbs). Negative for fever.       Feels "tired."  HENT: Negative for congestion, ear discharge, ear pain, hearing loss, nosebleeds, sinus pain, sore throat and tinnitus.        Dry mouth.  Eyes: Positive for blurred vision and photophobia. Negative for double vision.  Respiratory: Negative for cough, hemoptysis, sputum production and shortness of breath.   Cardiovascular: Positive for palpitations (flutters). Negative for chest pain and leg swelling.  Gastrointestinal: Positive for heartburn and nausea (depending on food intake). Negative for abdominal pain, blood in stool, constipation, diarrhea, melena and vomiting.       Ice pica improved.  Genitourinary: Negative for dysuria, frequency, hematuria and urgency.  Musculoskeletal: Positive for myalgias (foot cramping). Negative for back pain, joint pain and neck pain.  Skin: Negative for itching and rash.  Neurological: Positive for dizziness (occasional) and headaches (occasional). Negative for tingling, sensory change and weakness.       Reports restless legs  Endo/Heme/Allergies: Does not bruise/bleed easily.       Always cold.  Psychiatric/Behavioral: Negative for depression and memory loss. The patient is not nervous/anxious and does not have insomnia.   All other systems reviewed and are negative.  Performance status (ECOG): 1  Vitals Blood pressure 115/84, pulse 74, temperature (!) 96 F (35.6 C), temperature source Tympanic, resp. rate 16, weight 142 lb 4.9 oz (64.5 kg), SpO2 100 %.   Physical Exam Vitals and nursing note reviewed.  Constitutional:      General: She is not in acute  distress.    Appearance: Normal appearance. She is well-developed.     Interventions: Face mask in place.  HENT:     Head: Normocephalic and atraumatic.     Comments: Long brown hair pulled up.    Mouth/Throat:     Mouth: Mucous membranes are moist. No oral lesions.  Eyes:     Conjunctiva/sclera: Conjunctivae normal.     Pupils: Pupils are equal, round, and reactive to light.     Comments: Brown eyes.  Cardiovascular:     Rate and Rhythm: Normal rate and regular rhythm.     Heart sounds: Normal heart sounds. No murmur heard. No friction rub. No gallop.   Pulmonary:     Effort: Pulmonary effort is normal. No respiratory distress.     Breath sounds: Normal breath sounds. No wheezing, rhonchi or rales.  Chest:  Breasts:     Right: No axillary adenopathy or supraclavicular adenopathy.     Left: No axillary adenopathy or supraclavicular adenopathy.    Abdominal:     General: Bowel sounds are normal.     Palpations: Abdomen is soft. There is no hepatomegaly, splenomegaly or mass.     Tenderness:  There is no abdominal tenderness. There is no guarding or rebound.  Musculoskeletal:        General: No tenderness. Normal range of motion.     Cervical back: Normal range of motion and neck supple.  Lymphadenopathy:     Head:     Right side of head: No preauricular, posterior auricular or occipital adenopathy.     Left side of head: No preauricular, posterior auricular or occipital adenopathy.     Cervical: No cervical adenopathy.     Upper Body:     Right upper body: No supraclavicular or axillary adenopathy.     Left upper body: No supraclavicular or axillary adenopathy.     Lower Body: No right inguinal adenopathy. No left inguinal adenopathy.  Skin:    General: Skin is warm and dry.     Findings: No bruising, erythema, lesion or rash.  Neurological:     Mental Status: She is alert and oriented to person, place, and time.  Psychiatric:        Behavior: Behavior normal.         Thought Content: Thought content normal.        Judgment: Judgment normal.    Appointment on 02/02/2020  Component Date Value Ref Range Status  . Folate 02/02/2020 4.8* >5.9 ng/mL Final   Performed at [facility], [address] [place], [place] [address]  . Vitamin B-12 02/02/2020 169* 180 - 914 pg/mL Final   Comment: (NOTE) This assay is not validated for testing neonatal or myeloproliferative syndrome specimens for Vitamin B12 levels. Performed at [facility], [address], [place], [place] [address]   . WBC 02/02/2020 5.5  4.0 - 10.5 K/uL Final  . RBC 02/02/2020 4.92  3.87 - 5.11 MIL/uL Final  . Hemoglobin 02/02/2020 14.1  12.0 - 15.0 g/dL Final  . HCT 09/81/1914 42.0  36.0 - 46.0 % Final  . MCV 02/02/2020 85.4  80.0 - 100.0 fL Final  . MCH 02/02/2020 28.7  26.0 - 34.0 pg Final  . MCHC 02/02/2020 33.6  30.0 - 36.0 g/dL Final  . RDW 78/29/5621 16.3* 11.5 - 15.5 % Final  . Platelets 02/02/2020 242  150 - 400 K/uL Final  . nRBC 02/02/2020 0.0  0.0 - 0.2 % Final  . Neutrophils Relative % 02/02/2020 46  % Final  . Neutro Abs 02/02/2020 2.6  1.7 - 7.7 K/uL Final  . Lymphocytes Relative 02/02/2020 45  % Final  . Lymphs Abs 02/02/2020 2.5  0.7 - 4.0 K/uL Final  . Monocytes Relative 02/02/2020 7  % Final  . Monocytes Absolute 02/02/2020 0.4  0.1 - 1.0 K/uL Final  . Eosinophils Relative 02/02/2020 1  % Final  . Eosinophils Absolute 02/02/2020 0.1  0.0 - 0.5 K/uL Final  . Basophils Relative 02/02/2020 1  % Final  . Basophils Absolute 02/02/2020 0.0  0.0 - 0.1 K/uL Final  . Immature Granulocytes 02/02/2020 0  % Final  . Abs Immature Granulocytes 02/02/2020 0.01  0.00 - 0.07 K/uL Final   Performed at [facility], [address], [place], [place] [address]  . Iron 02/02/2020 100  28 - 170 ug/dL Final  . TIBC 78/46/9629 388  250 - 450 ug/dL Final  . Saturation Ratios 02/02/2020 26  10.4 - 31.8 % Final  . UIBC 02/02/2020 288  ug/dL Final   Performed at  [facility], [address], [place], [place] [address]  . Ferritin 02/02/2020 34  11 -  307 ng/mL Final   Performed at [facility], [address] [place], [place] [id number]    Assessment:  Nichole Gentry is a 44 y.o. female s/p gastric bypass surgery with iron deficiency anemia.  She may have celiac disease.  She is intolerant of oral iron.  Labs on 10/07/2019 revealed a hematocrit 29.9, hemoglobin 8.9, MCV 73.1, platelets 266,000, WBC 5400.  Ferritin was 3. Iron saturation was 10% with a TIBC 406.2.  Vitamin B12 was 131.  B12 was > 7500 and folate 18.1 on 10/18/2019.  She has iron deficiency.   Ferritin has been followed:  3 on 10/07/2019 and 20 on 12/12/2019.   She received Venofer on 10/07/2019, x 3 (10/31/2019 - 11/14/2019), and x 2 (12/15/2019 - 12/22/2019).  EGD in 2019 revealed a hiatal hernia, mildly dilated esophagus and dysmotility esophagus.  There were fundic gland polyps.  Duodenal biopsies revealed some increase in epithelial lymphocytes.  Colonoscopy on 04/22/2019 revealed only hemorrhoids.  She is on a gluten-free diet.  EGD on 11/07/2019 revealed salmon-colored mucosa suspicious for short-segment Barrett's esophagus. There was a small hiatal hernia. Roux-en-Y gastrojejunostomy with gastrojejunal anastomosis characterized by healthy appearing mucosa. Stomach and esophagus biopsies showed chronic inflammation. There was no intestinal metaplasia, dysplasia, and malignancy.  She has B12 deficiency.  B12 was 131 and > 7500 (after injection) on 10/18/2019.  B12 was 169 on 02/02/2020.  She received B12 weekly x 4 (last 10/18/2019).  Symptomatically, she has been tired.  She has been eating less ice. Exam is stable.  Plan: 1.   Labs today:  CBC with diff, ferritin, iron studies, B12, folate. 2.   Iron deficiency  Hematocrit 42.0.  Hemoglobin 14.1.  MCV 85.4 on 02/02/2020.     Ferritin 34 with an iron saturation of 26% and a TIBC of 388.  Patient has iron  deficiency anemia likely secondary to gastrojejunostomy and possible celiac disease.             EGD and colonoscopy revealed no evidence of blood loss.             She is intolerant of oral iron.             Last received Venofer on 12/22/2019.  Continue to monitor. 3.   B12 deficiency  Patient has B12 deficiency likely secondary to gastrojejunostomy.             B12 was 169 today.             B12 goal is 400.             B12 today. 4.   Folate deficiency  Folate is 4.8 today.  Begin folic acid 1 mg a day with repeat labs in 1 month. 5.   No Venofer today. 6.   RN:  Please provide Rx for syringes for B12 injections at home. 7.   RTC in 1 month for labs (folate). 8.   RTC in 2 months for labs (CBC with diff, ferritin). 9.   RTC in 4 months for MD assessment, labs (CBC with diff, ferritin, iron studies- day before) and +/- Venofer.  Addendum: Patient contacted regarding low folate and need to start folic acid 1 mg a day.  Folate level will be checked in 1 month.  I discussed the assessment and treatment plan with the patient.  The patient was provided an opportunity to ask questions and all were answered.  The patient agreed with the plan and demonstrated an understanding of  the instructions.  The patient was advised to call back if the symptoms worsen or if the condition fails to improve as anticipated.   Rosey Bath, MD, PhD    02/02/2020, 2:18 PM  I, Danella Penton Tufford, am acting as Neurosurgeon for [employer]. Merlene Pulling, MD, PhD.  I, Dhruva Orndoff C. Merlene Pulling, MD, have reviewed the above documentation for accuracy and completeness, and I agree with the above.

## 2020-02-02 ENCOUNTER — Other Ambulatory Visit: Payer: Self-pay

## 2020-02-02 ENCOUNTER — Encounter: Payer: Self-pay | Admitting: Hematology and Oncology

## 2020-02-02 ENCOUNTER — Inpatient Hospital Stay: Payer: Federal, State, Local not specified - PPO

## 2020-02-02 ENCOUNTER — Telehealth: Payer: Self-pay

## 2020-02-02 ENCOUNTER — Inpatient Hospital Stay
Payer: Federal, State, Local not specified - PPO | Attending: Hematology and Oncology | Admitting: Hematology and Oncology

## 2020-02-02 ENCOUNTER — Other Ambulatory Visit: Payer: Self-pay | Admitting: Hematology and Oncology

## 2020-02-02 VITALS — BP 115/84 | HR 74 | Temp 96.0°F | Resp 16 | Wt 142.3 lb

## 2020-02-02 DIAGNOSIS — K9589 Other complications of other bariatric procedure: Secondary | ICD-10-CM | POA: Diagnosis not present

## 2020-02-02 DIAGNOSIS — Z79899 Other long term (current) drug therapy: Secondary | ICD-10-CM | POA: Insufficient documentation

## 2020-02-02 DIAGNOSIS — D508 Other iron deficiency anemias: Secondary | ICD-10-CM

## 2020-02-02 DIAGNOSIS — E538 Deficiency of other specified B group vitamins: Secondary | ICD-10-CM

## 2020-02-02 LAB — CBC WITH DIFFERENTIAL/PLATELET
Abs Immature Granulocytes: 0.01 10*3/uL (ref 0.00–0.07)
Basophils Absolute: 0 10*3/uL (ref 0.0–0.1)
Basophils Relative: 1 %
Eosinophils Absolute: 0.1 10*3/uL (ref 0.0–0.5)
Eosinophils Relative: 1 %
HCT: 42 % (ref 36.0–46.0)
Hemoglobin: 14.1 g/dL (ref 12.0–15.0)
Immature Granulocytes: 0 %
Lymphocytes Relative: 45 %
Lymphs Abs: 2.5 10*3/uL (ref 0.7–4.0)
MCH: 28.7 pg (ref 26.0–34.0)
MCHC: 33.6 g/dL (ref 30.0–36.0)
MCV: 85.4 fL (ref 80.0–100.0)
Monocytes Absolute: 0.4 10*3/uL (ref 0.1–1.0)
Monocytes Relative: 7 %
Neutro Abs: 2.6 10*3/uL (ref 1.7–7.7)
Neutrophils Relative %: 46 %
Platelets: 242 10*3/uL (ref 150–400)
RBC: 4.92 MIL/uL (ref 3.87–5.11)
RDW: 16.3 % — ABNORMAL HIGH (ref 11.5–15.5)
WBC: 5.5 10*3/uL (ref 4.0–10.5)
nRBC: 0 % (ref 0.0–0.2)

## 2020-02-02 LAB — VITAMIN B12: Vitamin B-12: 169 pg/mL — ABNORMAL LOW (ref 180–914)

## 2020-02-02 LAB — IRON AND TIBC
Iron: 100 ug/dL (ref 28–170)
Saturation Ratios: 26 % (ref 10.4–31.8)
TIBC: 388 ug/dL (ref 250–450)
UIBC: 288 ug/dL

## 2020-02-02 LAB — FERRITIN: Ferritin: 34 ng/mL (ref 11–307)

## 2020-02-02 LAB — FOLATE: Folate: 4.8 ng/mL — ABNORMAL LOW (ref >5.9)

## 2020-02-02 MED ORDER — CYANOCOBALAMIN 1000 MCG/ML IJ SOLN
1000.0000 ug | Freq: Once | INTRAMUSCULAR | Status: AC
  Administered 2020-02-02: 1000 ug via INTRAMUSCULAR

## 2020-02-02 MED ORDER — FOLIC ACID 1 MG PO TABS: 1.0000 mg | ORAL_TABLET | Freq: Every day | ORAL | 5 refills | Status: DC

## 2020-02-02 NOTE — Progress Notes (Signed)
Tolerated B12 injection well. Discharged home in stable condition.

## 2020-02-02 NOTE — Progress Notes (Signed)
No new changes noted today 

## 2020-02-02 NOTE — Telephone Encounter (Signed)
Spoke with the patient to inform her that her Folate was low and Per Dr Merlene Pulling she need to start oral Folate 1 mg daily and repaet labs in 1 month. The patient was understanding and agreeable.

## 2020-02-09 ENCOUNTER — Telehealth: Payer: Self-pay | Admitting: Hematology and Oncology

## 2020-02-10 ENCOUNTER — Telehealth: Payer: Self-pay

## 2020-02-10 NOTE — Telephone Encounter (Signed)
Pt. called and wanted to speak to nurse/MD about her B-12 injections(clarification).

## 2020-02-10 NOTE — Telephone Encounter (Signed)
Left a voicemail for patient stating the pharmacy stated she needed to ask for syringes once arrival for medication due to her insurance not covering it.

## 2020-03-01 ENCOUNTER — Other Ambulatory Visit: Payer: Federal, State, Local not specified - PPO

## 2020-03-03 DIAGNOSIS — E538 Deficiency of other specified B group vitamins: Secondary | ICD-10-CM | POA: Insufficient documentation

## 2020-03-05 ENCOUNTER — Other Ambulatory Visit: Payer: Self-pay

## 2020-03-05 ENCOUNTER — Inpatient Hospital Stay: Payer: Federal, State, Local not specified - PPO | Attending: Hematology and Oncology

## 2020-03-05 DIAGNOSIS — Z79899 Other long term (current) drug therapy: Secondary | ICD-10-CM | POA: Diagnosis not present

## 2020-03-05 DIAGNOSIS — E538 Deficiency of other specified B group vitamins: Secondary | ICD-10-CM | POA: Diagnosis not present

## 2020-03-05 DIAGNOSIS — D508 Other iron deficiency anemias: Secondary | ICD-10-CM

## 2020-03-05 LAB — FOLATE: Folate: 17.8 ng/mL (ref >5.9)

## 2020-03-07 ENCOUNTER — Other Ambulatory Visit: Payer: Self-pay

## 2020-03-07 MED ORDER — CYANOCOBALAMIN 1000 MCG/ML IJ SOLN: 1000.0000 ug | INTRAMUSCULAR | 1 refills | Status: DC

## 2020-03-07 NOTE — Progress Notes (Signed)
Patient states she has not given herself any injections at home at all. I instructed her to give herself weekly x 3 then monthly. However, she stated noone ever sent the rx to the pharmacy with instructions. I have pended a request to you. Can you please send this to pharmacy([facility] [place]). Thanks

## 2020-03-07 NOTE — Progress Notes (Signed)
Called patient to inform her that her is to continue folic acid. Patient has question regarding how much B 12 to take and if any B 12 was called into pharm and if she is to take it weekly or monthly.

## 2020-03-21 ENCOUNTER — Telehealth: Payer: Self-pay

## 2020-03-29 ENCOUNTER — Inpatient Hospital Stay: Payer: Federal, State, Local not specified - PPO

## 2020-04-05 ENCOUNTER — Other Ambulatory Visit: Payer: Self-pay

## 2020-04-05 ENCOUNTER — Inpatient Hospital Stay: Payer: Federal, State, Local not specified - PPO | Attending: Hematology and Oncology

## 2020-04-05 DIAGNOSIS — D509 Iron deficiency anemia, unspecified: Secondary | ICD-10-CM | POA: Diagnosis not present

## 2020-04-05 DIAGNOSIS — D508 Other iron deficiency anemias: Secondary | ICD-10-CM

## 2020-04-05 DIAGNOSIS — K9589 Other complications of other bariatric procedure: Secondary | ICD-10-CM

## 2020-04-05 LAB — CBC WITH DIFFERENTIAL/PLATELET
Abs Immature Granulocytes: 0.01 10*3/uL (ref 0.00–0.07)
Basophils Absolute: 0 10*3/uL (ref 0.0–0.1)
Basophils Relative: 1 %
Eosinophils Absolute: 0.1 10*3/uL (ref 0.0–0.5)
Eosinophils Relative: 1 %
HCT: 42.4 % (ref 36.0–46.0)
Hemoglobin: 14.9 g/dL (ref 12.0–15.0)
Immature Granulocytes: 0 %
Lymphocytes Relative: 36 %
Lymphs Abs: 2.1 10*3/uL (ref 0.7–4.0)
MCH: 30.9 pg (ref 26.0–34.0)
MCHC: 35.1 g/dL (ref 30.0–36.0)
MCV: 88 fL (ref 80.0–100.0)
Monocytes Absolute: 0.5 10*3/uL (ref 0.1–1.0)
Monocytes Relative: 8 %
Neutro Abs: 3.3 10*3/uL (ref 1.7–7.7)
Neutrophils Relative %: 54 %
Platelets: 281 10*3/uL (ref 150–400)
RBC: 4.82 MIL/uL (ref 3.87–5.11)
RDW: 11.9 % (ref 11.5–15.5)
WBC: 6 10*3/uL (ref 4.0–10.5)
nRBC: 0 % (ref 0.0–0.2)

## 2020-04-05 LAB — FERRITIN: Ferritin: 46 ng/mL (ref 11–307)

## 2020-04-16 ENCOUNTER — Telehealth: Payer: Self-pay

## 2020-04-16 NOTE — Telephone Encounter (Signed)
  When B12 is very low, patients receive B12 weekly x 4 then go to monthly injections.  M

## 2020-04-17 NOTE — Telephone Encounter (Signed)
  Is there anything we need to do?  M

## 2020-04-17 NOTE — Telephone Encounter (Signed)
Patient states she is aware of taking monthly b12 injections.

## 2020-04-18 NOTE — Telephone Encounter (Signed)
No nothing to be done at this time. Thanks.

## 2020-05-29 ENCOUNTER — Other Ambulatory Visit: Payer: Self-pay

## 2020-05-29 DIAGNOSIS — D508 Other iron deficiency anemias: Secondary | ICD-10-CM

## 2020-05-29 DIAGNOSIS — E538 Deficiency of other specified B group vitamins: Secondary | ICD-10-CM

## 2020-05-30 ENCOUNTER — Inpatient Hospital Stay: Payer: Federal, State, Local not specified - PPO | Attending: Oncology | Admitting: Oncology

## 2020-05-30 ENCOUNTER — Other Ambulatory Visit: Payer: Self-pay

## 2020-05-30 DIAGNOSIS — D509 Iron deficiency anemia, unspecified: Secondary | ICD-10-CM | POA: Diagnosis not present

## 2020-05-30 DIAGNOSIS — K9589 Other complications of other bariatric procedure: Secondary | ICD-10-CM

## 2020-05-30 DIAGNOSIS — Z9884 Bariatric surgery status: Secondary | ICD-10-CM | POA: Diagnosis not present

## 2020-05-30 DIAGNOSIS — R0602 Shortness of breath: Secondary | ICD-10-CM | POA: Insufficient documentation

## 2020-05-30 DIAGNOSIS — K219 Gastro-esophageal reflux disease without esophagitis: Secondary | ICD-10-CM | POA: Diagnosis not present

## 2020-05-30 DIAGNOSIS — Z79899 Other long term (current) drug therapy: Secondary | ICD-10-CM | POA: Insufficient documentation

## 2020-05-30 DIAGNOSIS — R42 Dizziness and giddiness: Secondary | ICD-10-CM | POA: Diagnosis not present

## 2020-05-30 DIAGNOSIS — E538 Deficiency of other specified B group vitamins: Secondary | ICD-10-CM | POA: Diagnosis not present

## 2020-05-30 DIAGNOSIS — D508 Other iron deficiency anemias: Secondary | ICD-10-CM

## 2020-05-30 DIAGNOSIS — R002 Palpitations: Secondary | ICD-10-CM | POA: Insufficient documentation

## 2020-05-30 LAB — CBC WITH DIFFERENTIAL/PLATELET
Abs Immature Granulocytes: 0.01 10*3/uL (ref 0.00–0.07)
Basophils Absolute: 0 10*3/uL (ref 0.0–0.1)
Basophils Relative: 1 %
Eosinophils Absolute: 0.1 10*3/uL (ref 0.0–0.5)
Eosinophils Relative: 2 %
HCT: 40.3 % (ref 36.0–46.0)
Hemoglobin: 14 g/dL (ref 12.0–15.0)
Immature Granulocytes: 0 %
Lymphocytes Relative: 34 %
Lymphs Abs: 1.6 10*3/uL (ref 0.7–4.0)
MCH: 30.9 pg (ref 26.0–34.0)
MCHC: 34.7 g/dL (ref 30.0–36.0)
MCV: 89 fL (ref 80.0–100.0)
Monocytes Absolute: 0.5 10*3/uL (ref 0.1–1.0)
Monocytes Relative: 10 %
Neutro Abs: 2.5 10*3/uL (ref 1.7–7.7)
Neutrophils Relative %: 53 %
Platelets: 252 10*3/uL (ref 150–400)
RBC: 4.53 MIL/uL (ref 3.87–5.11)
RDW: 11.8 % (ref 11.5–15.5)
WBC: 4.7 10*3/uL (ref 4.0–10.5)
nRBC: 0 % (ref 0.0–0.2)

## 2020-05-30 LAB — VITAMIN B12: Vitamin B-12: 293 pg/mL (ref 180–914)

## 2020-05-30 LAB — IRON AND TIBC
Iron: 25 ug/dL — ABNORMAL LOW (ref 28–170)
Saturation Ratios: 6 % — ABNORMAL LOW (ref 10.4–31.8)
TIBC: 448 ug/dL (ref 250–450)
UIBC: 423 ug/dL

## 2020-05-30 LAB — FERRITIN: Ferritin: 46 ng/mL (ref 11–307)

## 2020-05-31 ENCOUNTER — Encounter: Payer: Self-pay | Admitting: Oncology

## 2020-05-31 ENCOUNTER — Inpatient Hospital Stay (HOSPITAL_BASED_OUTPATIENT_CLINIC_OR_DEPARTMENT_OTHER): Payer: Federal, State, Local not specified - PPO | Admitting: Oncology

## 2020-05-31 ENCOUNTER — Ambulatory Visit: Payer: Federal, State, Local not specified - PPO

## 2020-05-31 VITALS — BP 114/82 | HR 100 | Temp 98.2°F | Resp 16 | Wt 139.9 lb

## 2020-05-31 VITALS — BP 126/85 | HR 90 | Resp 16

## 2020-05-31 DIAGNOSIS — D508 Other iron deficiency anemias: Secondary | ICD-10-CM | POA: Diagnosis not present

## 2020-05-31 DIAGNOSIS — E538 Deficiency of other specified B group vitamins: Secondary | ICD-10-CM | POA: Diagnosis not present

## 2020-05-31 DIAGNOSIS — K9589 Other complications of other bariatric procedure: Secondary | ICD-10-CM

## 2020-05-31 DIAGNOSIS — N926 Irregular menstruation, unspecified: Secondary | ICD-10-CM

## 2020-05-31 MED ORDER — SODIUM CHLORIDE 0.9 % IV SOLN: 200.0000 mg | Freq: Once | INTRAVENOUS | Status: DC

## 2020-05-31 MED ORDER — SODIUM CHLORIDE 0.9 % IV SOLN
Freq: Once | INTRAVENOUS | Status: AC
Start: 2020-05-31 — End: 2020-05-31
  Filled 2020-05-31: qty 250

## 2020-05-31 MED ORDER — IRON SUCROSE 20 MG/ML IV SOLN
200.0000 mg | Freq: Once | INTRAVENOUS | Status: AC
  Administered 2020-05-31: 200 mg via INTRAVENOUS
  Filled 2020-05-31: qty 10

## 2020-05-31 NOTE — Addendum Note (Signed)
Addended by: Charletta Cousin on: 05/31/2020 11:31 AM   Modules accepted: Orders

## 2020-05-31 NOTE — Progress Notes (Signed)
Received IV venofer today. Tolerated well. Discharged to home.

## 2020-05-31 NOTE — Progress Notes (Signed)
[facility]  [address] [place], [place] [address] Phone: [phone]  Fax: [phone]   Clinic Day:  05/31/2020  Referring physician: Evelene Croon, MD  Chief Complaint: Nichole Gentry is a 45 y.o. female s/p gastric bypass surgery with iron deficiency and B12 deficiency who is seen for 3 month assessment.  HPI: The patient was last seen in the hematology clinic on 02/01/2021. At that time she has been tired. She is due for B12 but needs syringes. Her urine color is back to normal. Her chills, night sweats, headaches, restless legs, dry mouth, sensitivity to light, blurred vision, heartburn, and nausea are stable. She has been eating less ice. She gets foot cramps when she is dehydrated and uses heating pads. She always feels cold.  The patient has palpitations/flutters every once in a while. She gets dizzy, lightheaded, and short of breath sometimes. She is not interested in a cardiology evaluation.  During the interim, she started folic acid ( ). She reports tolerating this medication.   Additionally, she has had red/purple discoloration of her fingers and toes for approximately 5 days after driving back and forth to [place]. She reports this happening one day at work, approximately 3 weeks ago.  Denies pain, trauma to arms/hands/legs/feet. Reports tingling, numbness, coldness of fingers, palms, and toes.   Reports changes in menstruation at the beginning of April, with another 4-5 days of heavy bright red bleeding starting on 05/22/20, and estimates using 4-5 tampons per day.  BC method: None.  Denies changes in sexual relationship, no concern for STD's.  She is interested ina GYN referral at this time.    Past Medical History:  Diagnosis Date  . ADD (attention deficit disorder)   . Anemia   . B12 deficiency   . GERD (gastroesophageal reflux disease)   . Low iron   . Migraine   . Simple febrile convulsions [facility])     Past  Surgical History:  Procedure Laterality Date  . ESOPHAGOGASTRODUODENOSCOPY (EGD) WITH PROPOFOL N/A 11/07/2019   Procedure: ESOPHAGOGASTRODUODENOSCOPY (EGD) WITH PROPOFOL;  Surgeon: Regis Bill, MD;  Location: [facility] ENDOSCOPY;  Service: Endoscopy;  Laterality: N/A;  . HIATAL HERNIA REPAIR    . LAPAROSCOPIC REVISION OF ROUXENY WITH UPPER ENDOSCOPY    . linx management system      Family History  Problem Relation Age of Onset  . Thyroid disease Mother   . Diabetes Mother   . Hyperlipidemia Father   . Lung cancer Father   . Brain cancer Father     Social History:  reports that she has never smoked. She has never used smokeless tobacco. She reports current alcohol use. She reports that she does not use drugs. The patient is alone today.  Allergies: No Known Allergies  Current Medications: Current Outpatient Medications  Medication Sig Dispense Refill  . amoxicillin (AMOXIL) 875 MG tablet Take 1 tablet (875 mg total) by mouth 2 (two) times daily. 20 tablet 0  . amphetamine-dextroamphetamine (ADDERALL) 10 MG tablet Take by mouth.    . Butalbital-APAP-Caffeine 50-325-40 MG capsule Take by mouth.    . cyanocobalamin (,VITAMIN B-12,) 1000 MCG/ML injection Inject 1 mL (1,000 mcg total) into the muscle every 30 (thirty) days. Administer weekly x 3 and then monthly. 1 mL 1  . folic acid (FOLVITE) 1 MG tablet Take 1 tablet (1 mg total) by mouth daily. 30 tablet 5  . LORazepam (ATIVAN) 0.5 MG tablet Take 0.5 mg by mouth 2 (two) times daily as needed. (  Patient not taking: Reported on 02/02/2020)    . meloxicam (MOBIC) 7.5 MG tablet Take 7.5 mg by mouth daily.    . metroNIDAZOLE (FLAGYL) 500 MG tablet SMARTSIG:1 Tablet(s) By Mouth Every 12 Hours    . Multiple Vitamin (MULTI-VITAMINS) TABS Take by mouth.     . ondansetron (ZOFRAN-ODT) 4 MG disintegrating tablet DISSOLVE 1 TABLET IN MOUTH THREE TIMES DAILY AS NEEDED    . pantoprazole (PROTONIX) 40 MG tablet Take by mouth.    . SUMAtriptan  (IMITREX) 100 MG tablet Take by mouth.    Marland Kitchen VYVANSE 70 MG capsule TAKE 1 CAPSULE BY MOUTH IN THE MORNING ONCE DAILY FOR 30 DAYS  0  . zolpidem (AMBIEN) 10 MG tablet Take by mouth.     No current facility-administered medications for this visit.    Review of Systems  Constitutional: Positive for chills (occasional), diaphoresis (occasional), malaise/fatigue and weight loss (3 lbs). Negative for fever.       Feels "tired." Weight fluctuations 8 pounds up and down (week to week).   HENT: Negative for congestion, ear discharge, ear pain, hearing loss, nosebleeds, sinus pain, sore throat and tinnitus.        Dry mouth.  Eyes: Positive for blurred vision and photophobia. Negative for double vision.  Respiratory: Negative for cough, hemoptysis, sputum production and shortness of breath.   Cardiovascular: Positive for palpitations (flutters). Negative for chest pain and leg swelling.  Gastrointestinal: Positive for heartburn and nausea (depending on food intake). Negative for abdominal pain, blood in stool, constipation, diarrhea, melena and vomiting.       Ice pica improved.  Genitourinary: Negative for dysuria, frequency, hematuria and urgency.  Musculoskeletal: Positive for myalgias (foot cramping). Negative for back pain, joint pain and neck pain.  Skin: Negative for itching and rash.  Neurological: Positive for dizziness (occasional) and headaches (occasional). Negative for tingling, sensory change and weakness.       Reports restless legs  Endo/Heme/Allergies: Does not bruise/bleed easily.       Always cold.  Psychiatric/Behavioral: Negative for depression, memory loss and suicidal ideas. The patient has insomnia. The patient is not nervous/anxious.   All other systems reviewed and are negative.  Performance status (ECOG): 1  Vitals Today's Vitals   05/31/20 1031  BP: 114/82  Pulse: 100  Resp: 16  Temp: 98.2 F (36.8 C)  SpO2: 100%  Weight: 63.5 kg  PainSc: 0-No pain   Body  mass index is 22.58 kg/m.   Physical Exam Vitals and nursing note reviewed.  Constitutional:      General: She is not in acute distress.    Appearance: Normal appearance. She is well-developed.     Interventions: Face mask in place.  HENT:     Head: Normocephalic and atraumatic.     Mouth/Throat:     Mouth: Mucous membranes are moist. No oral lesions.  Eyes:     Conjunctiva/sclera: Conjunctivae normal.     Pupils: Pupils are equal, round, and reactive to light.     Comments: Brown eyes.  Cardiovascular:     Rate and Rhythm: Normal rate and regular rhythm.     Heart sounds: Normal heart sounds. No murmur heard. No friction rub. No gallop.   Pulmonary:     Effort: Pulmonary effort is normal. No respiratory distress.     Breath sounds: Normal breath sounds. No wheezing, rhonchi or rales.  Chest:  Breasts:     Right: No axillary adenopathy or supraclavicular adenopathy.  Left: No axillary adenopathy or supraclavicular adenopathy.    Abdominal:     General: Bowel sounds are normal.     Palpations: Abdomen is soft. There is no hepatomegaly, splenomegaly or mass.     Tenderness: There is no abdominal tenderness. There is no guarding or rebound.  Musculoskeletal:        General: No tenderness. Normal range of motion.     Cervical back: Normal range of motion and neck supple.  Lymphadenopathy:     Head:     Right side of head: No preauricular, posterior auricular or occipital adenopathy.     Left side of head: No preauricular, posterior auricular or occipital adenopathy.     Cervical: No cervical adenopathy.     Upper Body:     Right upper body: No supraclavicular or axillary adenopathy.     Left upper body: No supraclavicular or axillary adenopathy.     Lower Body: No right inguinal adenopathy. No left inguinal adenopathy.  Skin:    General: Skin is warm and dry.     Findings: No bruising, erythema, lesion or rash.  Neurological:     Mental Status: She is alert and  oriented to person, place, and time.  Psychiatric:        Behavior: Behavior normal.        Thought Content: Thought content normal.        Judgment: Judgment normal.    Clinical Support on 05/30/2020  Component Date Value Ref Range Status  . Iron 05/30/2020 25* 28 - 170 ug/dL Final  . TIBC 16/10/960404/27/2022 448  250 - 450 ug/dL Final  . Saturation Ratios 05/30/2020 6* 10.4 - 31.8 % Final  . UIBC 05/30/2020 423  ug/dL Final   Performed at [facility], [address] [place], [place] [address]  . Ferritin 05/30/2020 46  11 - 307 ng/mL Final   Performed at [facility], [address] [place], [place] [address]  . WBC 05/30/2020 4.7  4.0 - 10.5 K/uL Final  . RBC 05/30/2020 4.53  3.87 - 5.11 MIL/uL Final  . Hemoglobin 05/30/2020 14.0  12.0 - 15.0 g/dL Final  . HCT 78/29/562104/27/2022 40.3  36.0 - 46.0 % Final  . MCV 05/30/2020 89.0  80.0 - 100.0 fL Final  . MCH 05/30/2020 30.9  26.0 - 34.0 pg Final  . MCHC 05/30/2020 34.7  30.0 - 36.0 g/dL Final  . RDW 30/86/578404/27/2022 11.8  11.5 - 15.5 % Final  . Platelets 05/30/2020 252  150 - 400 K/uL Final  . nRBC 05/30/2020 0.0  0.0 - 0.2 % Final  . Neutrophils Relative % 05/30/2020 53  % Final  . Neutro Abs 05/30/2020 2.5  1.7 - 7.7 K/uL Final  . Lymphocytes Relative 05/30/2020 34  % Final  . Lymphs Abs 05/30/2020 1.6  0.7 - 4.0 K/uL Final  . Monocytes Relative 05/30/2020 10  % Final  . Monocytes Absolute 05/30/2020 0.5  0.1 - 1.0 K/uL Final  . Eosinophils Relative 05/30/2020 2  % Final  . Eosinophils Absolute 05/30/2020 0.1  0.0 - 0.5 K/uL Final  . Basophils Relative 05/30/2020 1  % Final  . Basophils Absolute 05/30/2020 0.0  0.0 - 0.1 K/uL Final  . Immature Granulocytes 05/30/2020 0  % Final  . Abs Immature Granulocytes 05/30/2020 0.01  0.00 - 0.07 K/uL Final   Performed at [facility], [address] [place], [place] [address]  . Vitamin B-12 05/30/2020 293  180 - 914  pg/mL Final   Comment: (NOTE) This assay is not  validated for testing neonatal or myeloproliferative syndrome specimens for Vitamin B12 levels. Performed at [facility], [address], [place], [place] [address]     Assessment:  Nichole Gentry is a 45 y.o. female s/p gastric bypass surgery (estimates 2010) with iron deficiency anemia.  She may have celiac disease.  She is intolerant of oral iron.  Labs on 10/07/2019 revealed a hematocrit 29.9, hemoglobin 8.9, MCV 73.1, platelets 266,000, WBC 5400.  Ferritin was 3. Iron saturation was 10% with a TIBC 406.2.  Vitamin B12 was 131.  B12 was > 7500 and folate 18.1 on 10/18/2019.  She has iron deficiency.   Ferritin has been followed:  3 on 10/07/2019 and 20 on 12/12/2019.   She received Venofer on 10/07/2019, x 3 (10/31/2019 - 11/14/2019), and x 2 (12/15/2019 - 12/22/2019).  EGD in 2019 revealed a hiatal hernia, mildly dilated esophagus and dysmotility esophagus.  There were fundic gland polyps.  Duodenal biopsies revealed some increase in epithelial lymphocytes.  Colonoscopy on 04/22/2019 revealed only hemorrhoids.  She is on a gluten-free diet.  EGD on 11/07/2019 revealed salmon-colored mucosa suspicious for short-segment Barrett's esophagus. There was a small hiatal hernia. Roux-en-Y gastrojejunostomy with gastrojejunal anastomosis characterized by healthy appearing mucosa. Stomach and esophagus biopsies showed chronic inflammation. There was no intestinal metaplasia, dysplasia, and malignancy.  She has B12 deficiency.  B12 was 131 and > 7500 (after injection) on 10/18/2019.  B12 was 169 on 02/02/2020.  She received B12 weekly x 4 (last 10/18/2019).  Symptomatically, she has been tired.  She has been eating less ice. Exam is stable.  Plan: 1.   Labs today:  CBC with diff, ferritin, iron studies, B12, folate. 2.   Iron deficiency  HCT:  40.3 HGB: 14.0  MCV:  89.0 (05/30/2020).   Ferritin 46, iron saturation 6%  Hematocrit 42.0.  Hemoglobin 14.1.  MCV 85.4 on 02/02/2020.      Ferritin 34 with an iron saturation of 26% and a TIBC of 388.  Patient has iron deficiency anemia likely secondary to gastrojejunostomy and possible celiac disease.             EGD and colonoscopy (11/07/2019) revealed no evidence of blood loss.             She is intolerant of oral iron.             Last received Venofer on 12/22/2019.  Recommend IV Venofer x2 doses. 3.   B12 deficiency  Patient has B12 deficiency likely secondary to gastrojejunostomy.             B12 was 293 today.              B12 goal is 400.  Continue monthly B12 injections at home.              Repeat B12 lab in 3 months.  4.   Folate deficiency  Folate last drawn 03/05/2020: 17.8 (previously 4.8)  Continue folic acid 1 mg a day.    Disposition Recommend IV Venofer x2 doses-first dose today RTC 3 months for MD assessment, labs (CMP, CBC with diff, folate, ferritin, iron studies, B12, urine pregnancy- day before) and +/- Venofer.   The patient's diagnosis, an outline of the further diagnostic and laboratory studies which will be required, the recommendation for surgery, and alternatives were discussed with her and her accompanying family members.  All questions were answered to their satisfaction.  I personally had a face to face interaction and evaluated the patient jointly with the NP Student, Mrs. Deneise Lever.  I have reviewed her history and available records and have performed the key portions of the physical exam including general, HEENT, abdominal exam, pelvic exam with my findings confirming those documented above by the APP student.  I have discussed the case with the APP student and the patient.  I agree with the above documentation, assessment and plan which was fully formulated by me.  Counseling was completed by me.   Greater than 50% was spent in counseling and coordination of care with this patient including but not limited to discussion of the relevant topics above (See A&P) including, but not  limited to diagnosis and management of acute and chronic medical conditions.    Deneise Lever, Student FNP  Durenda Hurt, NP 06/01/2020 1:36 PM

## 2020-05-31 NOTE — Progress Notes (Signed)
Pt states that her feet, and toes turned a reddish purple color after being in a car for hours. Now dealing with severe cramping in her hands, has been drinking a lot of water but does not seem to help.As well as, legs swollen and heavy menstruals every two weeks has been going on for about a month and very bright red.

## 2020-06-01 ENCOUNTER — Other Ambulatory Visit: Payer: Self-pay

## 2020-06-03 NOTE — Progress Notes (Signed)
Error

## 2020-06-04 ENCOUNTER — Telehealth: Payer: Self-pay

## 2020-06-04 NOTE — Telephone Encounter (Signed)
Cancer center at [facility] referring for Heavy menstrual cycles and new onset irregular menses 4-5 days every two weeks. Voicemail not set up unable to leave message.

## 2020-06-06 ENCOUNTER — Inpatient Hospital Stay: Payer: Federal, State, Local not specified - PPO

## 2020-06-07 ENCOUNTER — Inpatient Hospital Stay: Payer: Federal, State, Local not specified - PPO

## 2020-06-12 ENCOUNTER — Other Ambulatory Visit: Payer: Self-pay

## 2020-06-13 ENCOUNTER — Other Ambulatory Visit: Payer: Self-pay

## 2020-06-13 ENCOUNTER — Inpatient Hospital Stay: Payer: Federal, State, Local not specified - PPO | Attending: Oncology

## 2020-06-13 ENCOUNTER — Inpatient Hospital Stay: Payer: Federal, State, Local not specified - PPO

## 2020-06-13 VITALS — BP 106/75 | HR 92 | Temp 98.2°F | Resp 16

## 2020-06-13 DIAGNOSIS — E538 Deficiency of other specified B group vitamins: Secondary | ICD-10-CM | POA: Insufficient documentation

## 2020-06-13 DIAGNOSIS — K9589 Other complications of other bariatric procedure: Secondary | ICD-10-CM

## 2020-06-13 DIAGNOSIS — Z9884 Bariatric surgery status: Secondary | ICD-10-CM | POA: Insufficient documentation

## 2020-06-13 DIAGNOSIS — D508 Other iron deficiency anemias: Secondary | ICD-10-CM | POA: Diagnosis not present

## 2020-06-13 LAB — PREGNANCY, URINE: Preg Test, Ur: NEGATIVE

## 2020-06-13 MED ORDER — SODIUM CHLORIDE 0.9 % IV SOLN
Freq: Once | INTRAVENOUS | Status: AC
  Filled 2020-06-13: qty 250

## 2020-06-13 MED ORDER — SODIUM CHLORIDE 0.9 % IV SOLN: 200.0000 mg | Freq: Once | INTRAVENOUS | Status: DC

## 2020-06-13 MED ORDER — IRON SUCROSE 20 MG/ML IV SOLN
200.0000 mg | Freq: Once | INTRAVENOUS | Status: AC
  Administered 2020-06-13: 200 mg via INTRAVENOUS
  Filled 2020-06-13: qty 10

## 2020-06-14 ENCOUNTER — Inpatient Hospital Stay: Payer: Federal, State, Local not specified - PPO

## 2020-06-20 ENCOUNTER — Inpatient Hospital Stay: Payer: Federal, State, Local not specified - PPO

## 2020-06-20 ENCOUNTER — Encounter: Payer: Federal, State, Local not specified - PPO | Admitting: Obstetrics and Gynecology

## 2020-06-21 ENCOUNTER — Other Ambulatory Visit (HOSPITAL_COMMUNITY)
Admission: RE | Admit: 2020-06-21 | Discharge: 2020-06-21 | Disposition: A | Discharge status: Home / Self Care | Payer: Federal, State, Local not specified - PPO | Source: Ambulatory Visit | Attending: Obstetrics and Gynecology | Admitting: Obstetrics and Gynecology

## 2020-06-21 ENCOUNTER — Ambulatory Visit: Payer: Federal, State, Local not specified - PPO | Admitting: Obstetrics and Gynecology

## 2020-06-21 ENCOUNTER — Encounter: Payer: Self-pay | Admitting: Obstetrics and Gynecology

## 2020-06-21 ENCOUNTER — Other Ambulatory Visit: Payer: Self-pay

## 2020-06-21 VITALS — BP 131/76 | Ht 66.0 in | Wt 139.0 lb

## 2020-06-21 DIAGNOSIS — Z113 Encounter for screening for infections with a predominantly sexual mode of transmission: Secondary | ICD-10-CM

## 2020-06-21 DIAGNOSIS — Z124 Encounter for screening for malignant neoplasm of cervix: Secondary | ICD-10-CM | POA: Insufficient documentation

## 2020-06-21 DIAGNOSIS — N939 Abnormal uterine and vaginal bleeding, unspecified: Secondary | ICD-10-CM

## 2020-06-21 DIAGNOSIS — Z1239 Encounter for other screening for malignant neoplasm of breast: Secondary | ICD-10-CM | POA: Diagnosis not present

## 2020-06-21 NOTE — Patient Instructions (Addendum)
[facility] [address] [place] [address]  [facility] [place]  [address] [place] [address]  Phone: [phone]   Abnormal Uterine Bleeding  Abnormal uterine bleeding is unusual bleeding from the uterus. It includes bleeding after sex, or bleeding or spotting between menstrual periods. It may also include bleeding that is heavier than normal, menstrual periods that last longer than usual, or bleeding that occurs after menopause. Abnormal uterine bleeding can affect teenagers, women in their reproductive years, pregnant women, and women who have reached menopause. Common causes of abnormal uterine bleeding include:  Pregnancy.  Growths of tissue (polyps).  Benign tumors or growths in the uterus (fibroids). These are not cancer.  Infection.  Cancer.  Too much or too little of some hormones in the body (hormonal imbalances). Any type of abnormal bleeding should be checked by a health care provider. Many cases are minor and simple to treat, but others may be more serious. Treatment will depend on the cause and severity of the bleeding. Follow these instructions at home: Medicines  Take over-the-counter and prescription medicines only as told by your health care provider.  Tell your health care provider about other medicines that you take. You may be asked to stop taking aspirin or medicines that contain aspirin. These medicines can make bleeding worse.  If you were prescribed iron pills, take them as told by your health care provider. Iron pills help to replace iron that your body loses because of this condition. Managing constipation In cases of severe bleeding, you may be asked to increase your iron intake to treat anemia. This may cause constipation. To prevent or treat constipation, you may need to:  Drink enough fluid to keep your urine pale yellow.  Take over-the-counter or prescription medicines.  Eat foods that are high in fiber,  such as beans, whole grains, and fresh fruits and vegetables.  Limit foods that are high in fat and processed sugars, such as fried or sweet foods. General instructions  Monitor your condition for any changes.  Do not use tampons, douche, or have sex until your health care provider says these things are okay.  Change your pads often.  Get regular exams. This includes pelvic exams and cervical cancer screenings. ? It is up to you to get the results of any tests that are done. Ask your health care provider, or the department that is doing the tests, when your results will be ready.  Keep all follow-up visits as told by your health care provider. This is important. Contact a health care provider if you:  Have bleeding that lasts for more than 1 week.  Feel dizzy at times.  Feel nauseous or you vomit.  Feel light-headed or weak.  Notice any other changes that show that your condition is getting worse. Get help right away if you:  Pass out.  Have bleeding that soaks through a pad every hour.  Have pain in the abdomen.  Have a fever or chills.  Become sweaty or weak.  Pass large blood clots from your vagina. Summary  Abnormal uterine bleeding is unusual bleeding from the uterus.  Any type of abnormal bleeding should be evaluated by a health care provider. Many cases are minor and simple to treat, but others may be more serious.  Treatment will depend on the cause of the bleeding.  Get help right away if you pass out, you have bleeding that soaks through a pad every hour, or you pass large blood  clots from your vagina. This information is not intended to replace advice given to you by your health care provider. Make sure you discuss any questions you have with your health care provider. Document Revised: 09/28/2019 Document Reviewed: 11/23/2018 Elsevier Patient Education  2021 [employer].

## 2020-06-21 NOTE — Progress Notes (Signed)
Gynecology Abnormal Uterine Bleeding Initial Evaluation   Chief Complaint:  Chief Complaint  Patient presents with  . Gynecologic Exam    Referral need to establish care with gyn per patient annual exam, heavy bleeding. Procedure RM    History of Present Illness:    Paitient is a 45 y.o. G4P0 who LMP was Patient's last menstrual period was 06/07/2020., presents today for a problem visit.  She complains of regular monthly menses approximately every 28 days last 4-5 days.  Flow is variable but not particularly heavy.  She only reports mild dysmenorrhea.  She has a menstrual cycle two menstrual cycles two week apart for her last two menstrual cycles though.  She has noted vasomotor symptoms and dizzy ness.  Past medical history notable for modified roux-en-y.  She also has strong family of thyroid disorders.  The patient menstrual complaints are acute.   Previous evaluation: none no pelvic imaging available for review  Hyperprolactinemia: Reports headaches, vision changes, no nipple discharge Thyroid: temperature intolerance, weight fluctuating but stable PCOS: no hirsutism, acne    Paramter Normal / Abnormal Prsent  Frequency Amenoorhea     Infrequent (>38 days)     Normal (?24 days ?38 days)     Freequent (<24 days) X  Duration Normal (?8 days) X   Prolonged (>8 days)    Regularity Regular (shortest to longest cycle variation ?7-9 days)* X   Irregular (shortest to longest cycle variation ?8-10days)*    Flow Volume Light    (Self reported) Normal X   Heavy        Intermenstrual Bleeding None X   Random     Cyclical early     Cyclical mid     Cyclical late        Unscheduled Bleeding  Not applicable X  (exogenous hormones) Absent     Present     FIGO AUB I System: *The available evidence suggests that, using these criteria, the normal range (shortest to longest) varies with age: 64-25 y of age, ?37 d; 71-41 y, ?7 d; and for 78-45 y, ?9 d    Review of Systems: Review  of Systems  Constitutional: Positive for diaphoresis and malaise/fatigue. Negative for chills and fever.  HENT: Negative.   Eyes: Negative.   Respiratory: Negative for cough and shortness of breath.   Cardiovascular: Negative for chest pain and palpitations.  Gastrointestinal: Negative for abdominal pain, constipation, diarrhea, heartburn, nausea and vomiting.  Genitourinary: Negative for dysuria, frequency and urgency.  Musculoskeletal: Negative.   Skin: Negative for itching and rash.  Neurological: Positive for dizziness. Negative for headaches.  Endo/Heme/Allergies: Negative for polydipsia.  Psychiatric/Behavioral: Negative.     Past Medical History:  Patient Active Problem List   Diagnosis Date Noted  . Folate deficiency 03/03/2020  . Iron deficiency anemia following bariatric surgery 10/18/2019  . B12 deficiency 10/18/2019    Past Surgical History:  Past Surgical History:  Procedure Laterality Date  . ESOPHAGOGASTRODUODENOSCOPY (EGD) WITH PROPOFOL N/A 11/07/2019   Procedure: ESOPHAGOGASTRODUODENOSCOPY (EGD) WITH PROPOFOL;  Surgeon: Regis Bill, MD;  Location: [facility] ENDOSCOPY;  Service: Endoscopy;  Laterality: N/A;  . HIATAL HERNIA REPAIR    . LAPAROSCOPIC REVISION OF ROUXENY WITH UPPER ENDOSCOPY    . linx management system      Obstetric History: G74P0  Family History:  Family History  Problem Relation Age of Onset  . Thyroid disease Mother   . Diabetes Mother   . Hyperlipidemia Father   .  Lung cancer Father   . Brain cancer Father     Social History:  Social History   Socioeconomic History  . Marital status: Single    Spouse name: Not on file  . Number of children: Not on file  . Years of education: Not on file  . Highest education level: Not on file  Occupational History  . Not on file  Tobacco Use  . Smoking status: Never Smoker  . Smokeless tobacco: Never Used  Vaping Use  . Vaping Use: Never used  Substance and Sexual Activity  . Alcohol  use: Yes    Comment: occasionally  . Drug use: Never  . Sexual activity: Not on file  Other Topics Concern  . Not on file  Social History Narrative  . Not on file   Social Determinants of Health   Financial Resource Strain: Not on file  Food Insecurity: Not on file  Transportation Needs: Not on file  Physical Activity: Not on file  Stress: Not on file  Social Connections: Not on file  Intimate Partner Violence: Not on file    Allergies:  No Known Allergies  Medications: Prior to Admission medications   Medication Sig Start Date End Date Taking? Authorizing Provider  Butalbital-APAP-Caffeine 50-325-40 MG capsule Take by mouth.   Yes [provider]  cyanocobalamin (,VITAMIN B-12,) 1000 MCG/ML injection Inject 1 mL (1,000 mcg total) into the muscle every 30 (thirty) days. Administer weekly x 3 and then monthly. 03/07/20  Yes Rosey Bath, MD  folic acid (FOLVITE) 1 MG tablet Take 1 tablet (1 mg total) by mouth daily. 02/02/20  Yes Corcoran, Ferdie Ping, MD  LORazepam (ATIVAN) 0.5 MG tablet Take 0.5 mg by mouth 2 (two) times daily as needed. 10/28/19  Yes [provider]  pantoprazole (PROTONIX) 40 MG tablet Take by mouth.   Yes [provider]  SUMAtriptan (IMITREX) 100 MG tablet Take by mouth.   Yes [provider]  VYVANSE 70 MG capsule TAKE 1 CAPSULE BY MOUTH IN THE MORNING ONCE DAILY FOR 30 DAYS 11/26/17  Yes [provider]  zolpidem (AMBIEN) 10 MG tablet Take by mouth.   Yes [provider]  amphetamine-dextroamphetamine (ADDERALL) 10 MG tablet Take by mouth.    [provider]  cyclobenzaprine (FLEXERIL) 10 MG tablet TAKE 1 TABLET BY ORAL ROUTE 3 TIMES EVERY DAY Patient not taking: Reported on 05/31/2020 04/11/20   [provider]  meloxicam (MOBIC) 7.5 MG tablet Take 7.5 mg by mouth daily. Patient not taking: Reported on 05/31/2020 10/13/19   [provider]  metroNIDAZOLE (FLAGYL) 500 MG tablet  SMARTSIG:1 Tablet(s) By Mouth Every 12 Hours Patient not taking: Reported on 05/31/2020 10/13/19   [provider]  mirtazapine (REMERON) 15 MG tablet Take 15 mg by mouth at bedtime. 06/18/20   [provider]  Multiple Vitamin (MULTI-VITAMINS) TABS Take by mouth.  Patient not taking: Reported on 05/31/2020    [provider]  ondansetron (ZOFRAN-ODT) 4 MG disintegrating tablet DISSOLVE 1 TABLET IN MOUTH THREE TIMES DAILY AS NEEDED Patient not taking: Reported on 05/31/2020 07/21/17   [provider]    Physical Exam Blood pressure 131/76, height 5\' 6"  (1.676 m), weight 139 lb (63 kg), last menstrual period 06/07/2020.  Patient's last menstrual period was 06/07/2020.  General: NAD, well nourished, appears stated age, well nourished appears stated age HEENT: normocephalic, anicteric Pulmonary: No increased work of breathing Cardiovascular: RRR, distal pulses 2+ Abdomen: soft, non-tender, non-distended. Genitourinary:  External:  Normal external female genitalia.  Normal urethral meatus, normal Bartholin's and Skene's glands.    Vagina: Normal vaginal mucosa, no evidence of prolapse.    Cervix: Grossly normal in appearance, no bleeding  Uterus: Non-enlarged, mobile, slightly irregular contour anterior suggestive a possible lower uterine segment fibroids.  No CMT  Adnexa: ovaries non-enlarged, no adnexal masses  Rectal: deferred  Lymphatic: no evidence of inguinal lymphadenopathy Extremities: no edema, erythema, or tenderness Neurologic: Grossly intact Psychiatric: mood appropriate, affect full  Female chaperone present for pelvic portions of the physical exam  Assessment: 45 y.o. G4P0 with abnormal uterine bleeding  Plan: Problem List Items Addressed This Visit   None   Visit Diagnoses    Breast screening    -  Primary   Relevant Orders   MM 3D SCREEN BREAST BILATERAL   Screening for malignant neoplasm of cervix       Relevant Orders   Cytology -  PAP   Abnormal uterine bleeding       Relevant Orders   FSH   Estradiol   TSH   Prolactin   US PELVIS TRANSVAGINAL NON-OB (TV ONLY)   Routine screening for STI (sexually transmitted infection)       Relevant Orders   Cytology - PAP      1) Discussed management options for abnormal uterine bleeding including expectant, NSAIDs, tranexamic acid (Lysteda), oral progesterone (Provera, norethindrone, megace), Depo Provera, Levonorgestrel containing IUD, endometrial ablation (Novasure) or hysterectomy as definitive surgical management.  Discussed risks and benefits of each method.   Final management decision will hinge on results of patient's work up and whether an underlying etiology for the patients bleeding symptoms can be discerned.  We will conduct a basic work up examining using the PALM-COIEN classification system.    The role of unopposed estrogen in the development of endometrial hyperplasia or carcinoma is discussed.  The risk of endometrial hyperplasia is linearly correlated with increasing BMI given the production of estrone by adipose tissue. Printed patient education handouts were given to the patient to review at home.  Bleeding precautions reviewed.   2) Healthcare maintenance - pap brought up to date - mammogram odered  3) Return if symptoms worsen or fail to improve, will contact with ultrasound results.   Vena Austria, MD, [facility], [facility] 06/21/2020, 9:20 AM

## 2020-06-22 LAB — ESTRADIOL: Estradiol: 85.5 pg/mL

## 2020-06-22 LAB — FOLLICLE STIMULATING HORMONE: FSH: 11.2 m[IU]/mL

## 2020-06-22 LAB — TSH: TSH: 1.19 u[IU]/mL (ref 0.450–4.500)

## 2020-06-22 LAB — PROLACTIN: Prolactin: 13.6 ng/mL (ref 4.8–23.3)

## 2020-06-26 LAB — CYTOLOGY - PAP
Adequacy: ABSENT
Chlamydia: NEGATIVE
Comment: NEGATIVE
Comment: NEGATIVE
Comment: NORMAL
Diagnosis: NEGATIVE
Diagnosis: REACTIVE
High risk HPV: NEGATIVE
Neisseria Gonorrhea: NEGATIVE

## 2020-08-01 ENCOUNTER — Ambulatory Visit
Admission: RE | Admit: 2020-08-01 | Discharge: 2020-08-01 | Disposition: A | Discharge status: Home / Self Care | Payer: Federal, State, Local not specified - PPO | Source: Ambulatory Visit | Attending: Obstetrics and Gynecology | Admitting: Obstetrics and Gynecology

## 2020-08-01 ENCOUNTER — Other Ambulatory Visit: Payer: Self-pay

## 2020-08-01 DIAGNOSIS — N939 Abnormal uterine and vaginal bleeding, unspecified: Secondary | ICD-10-CM | POA: Diagnosis not present

## 2020-08-03 ENCOUNTER — Ambulatory Visit (INDEPENDENT_AMBULATORY_CARE_PROVIDER_SITE_OTHER): Payer: Federal, State, Local not specified - PPO | Admitting: Obstetrics and Gynecology

## 2020-08-03 ENCOUNTER — Other Ambulatory Visit: Payer: Self-pay

## 2020-08-03 DIAGNOSIS — Z30011 Encounter for initial prescription of contraceptive pills: Secondary | ICD-10-CM | POA: Diagnosis not present

## 2020-08-03 DIAGNOSIS — N939 Abnormal uterine and vaginal bleeding, unspecified: Secondary | ICD-10-CM | POA: Diagnosis not present

## 2020-08-03 MED ORDER — SLYND 4 MG PO TABS: 1.0000 | ORAL_TABLET | Freq: Every day | ORAL | 3 refills | Status: DC

## 2020-08-03 NOTE — Progress Notes (Signed)
I connected with Nichole Gentry on 08/04/20 at 11:10 AM EDT by telephone and verified that I am speaking with the correct person using two identifiers.   I discussed the limitations, risks, security and privacy concerns of performing an evaluation and management service by telephone and the availability of in person appointments. I also discussed with the patient that there may be a patient responsible charge related to this service. The patient expressed understanding and agreed to proceed.  The patient was at home I spoke with the patient from my workstation phone The names of people involved in this encounter were: Nichole Gentry , and Vena Austria   Gynecology Ultrasound Follow Up  Chief Complaint:  Chief Complaint  Patient presents with   Follow-up    Phone visit -  F/U US, no concerns.     History of Present Illness: Patient is a 45 y.o. female who presents today for ultrasound evaluation of AUB .  Ultrasound demonstrates the following findgins Adnexa: no masses seen consistency: normal Uterus: Non-enlarged without evidence of uterine fibroids with endometrial stripe  non-thickened and no endometrial abnormalities Additional: no free fluid  Review of Systems: Review of Systems  Constitutional: Negative.   Genitourinary: Negative.   Neurological:  Negative for headaches.   Past Medical History:  Past Medical History:  Diagnosis Date   ADD (attention deficit disorder)    Anemia    B12 deficiency    GERD (gastroesophageal reflux disease)    Low iron    Migraine    Simple febrile convulsions (HCC)     Past Surgical History:  Past Surgical History:  Procedure Laterality Date   ESOPHAGOGASTRODUODENOSCOPY (EGD) WITH PROPOFOL N/A 11/07/2019   Procedure: ESOPHAGOGASTRODUODENOSCOPY (EGD) WITH PROPOFOL;  Surgeon: Regis Bill, MD;  Location: [facility] ENDOSCOPY;  Service: Endoscopy;  Laterality: N/A;   HIATAL HERNIA REPAIR     LAPAROSCOPIC REVISION OF ROUXENY WITH  UPPER ENDOSCOPY     linx management system      Gynecologic History:  No LMP recorded. Contraception: none  Family History:  Family History  Problem Relation Age of Onset   Thyroid disease Mother    Diabetes Mother    Hyperlipidemia Father    Lung cancer Father    Brain cancer Father     Social History:  Social History   Socioeconomic History   Marital status: Single    Spouse name: Not on file   Number of children: Not on file   Years of education: Not on file   Highest education level: Not on file  Occupational History   Not on file  Tobacco Use   Smoking status: Never   Smokeless tobacco: Never  Vaping Use   Vaping Use: Never used  Substance and Sexual Activity   Alcohol use: Yes    Comment: occasionally   Drug use: Never   Sexual activity: Yes    Birth control/protection: None  Other Topics Concern   Not on file  Social History Narrative   Not on file   Social Determinants of Health   Financial Resource Strain: Not on file  Food Insecurity: Not on file  Transportation Needs: Not on file  Physical Activity: Not on file  Stress: Not on file  Social Connections: Not on file  Intimate Partner Violence: Not on file    Allergies:  No Known Allergies  Medications: Prior to Admission medications   Medication Sig Start Date End Date Taking? Authorizing Provider  amphetamine-dextroamphetamine (ADDERALL) 10 MG tablet Take by  mouth.   Yes [provider]  Butalbital-APAP-Caffeine 50-325-40 MG capsule Take by mouth.   Yes [provider]  cyanocobalamin (,VITAMIN B-12,) 1000 MCG/ML injection Inject 1 mL (1,000 mcg total) into the muscle every 30 (thirty) days. Administer weekly x 3 and then monthly. 03/07/20  Yes Rosey Bath, MD  folic acid (FOLVITE) 1 MG tablet Take 1 tablet (1 mg total) by mouth daily. 02/02/20  Yes Corcoran, Ferdie Ping, MD  hydrochlorothiazide (HYDRODIURIL) 12.5 MG tablet Take 12.5 mg by mouth daily. 07/26/20  Yes  [provider]  LORazepam (ATIVAN) 0.5 MG tablet Take 0.5 mg by mouth 2 (two) times daily as needed. 10/28/19  Yes [provider]  mirtazapine (REMERON) 15 MG tablet Take 15 mg by mouth at bedtime. 06/18/20  Yes [provider]  Multiple Vitamin (MULTI-VITAMINS) TABS Take by mouth.   Yes [provider]  pantoprazole (PROTONIX) 40 MG tablet Take by mouth.   Yes [provider]  VYVANSE 70 MG capsule TAKE 1 CAPSULE BY MOUTH IN THE MORNING ONCE DAILY FOR 30 DAYS 11/26/17  Yes [provider]  zolpidem (AMBIEN) 10 MG tablet Take by mouth.   Yes [provider]  cyclobenzaprine (FLEXERIL) 10 MG tablet TAKE 1 TABLET BY ORAL ROUTE 3 TIMES EVERY DAY Patient not taking: Reported on 05/31/2020 04/11/20   [provider]  meloxicam (MOBIC) 7.5 MG tablet Take 7.5 mg by mouth daily. Patient not taking: No sig reported 10/13/19   [provider]  ondansetron (ZOFRAN-ODT) 4 MG disintegrating tablet  07/21/17   [provider]  SUMAtriptan (IMITREX) 100 MG tablet Take by mouth.    [provider]    Physical Exam Vitals: There were no vitals taken for this visit.  No physical exam as this was a remote telephone visit to promote social distancing during the current COVID-19 Pandemic  US PELVIC COMPLETE WITH TRANSVAGINAL  Result Date: 08/03/2020 CLINICAL DATA:  Dysfunctional uterine bleeding.  LMP 07/18/2020 EXAM: TRANSABDOMINAL AND TRANSVAGINAL ULTRASOUND OF PELVIS DOPPLER ULTRASOUND OF OVARIES TECHNIQUE: Both transabdominal and transvaginal ultrasound examinations of the pelvis were performed. Transabdominal technique was performed for global imaging of the pelvis including uterus, ovaries, adnexal regions, and pelvic cul-de-sac. It was necessary to proceed with endovaginal exam following the transabdominal exam to visualize the endometrium and ovaries bilaterally. Color and duplex Doppler ultrasound was utilized to  evaluate blood flow to the ovaries. COMPARISON:  None. FINDINGS: Uterus Measurements: 9.0 x 4.5 x 5.6 cm = volume: 118 mL. No fibroids or other mass visualized. Nabothian cyst noted within the cervix. Endometrium Thickness: 7 mm.  No focal abnormality visualized. Right ovary Measurements: 3.4 x 1.9 x 2.4 cm = volume: 8 mL. Normal appearance/no adnexal mass. Corpus luteum noted within the right ovary. Left ovary Measurements: 2.9 x 1.4 x 1.7 cm = volume: 4 mL. Normal appearance/no adnexal mass. Pulsed Doppler evaluation of both ovaries demonstrates normal low-resistance arterial and venous waveforms. Other findings No abnormal free fluid. IMPRESSION: Normal pelvic sonogram. Electronically Signed   By: Helyn Numbers MD   On: 08/03/2020 03:43     Assessment: 45 y.o. G4P0  follow up ultrasound AUB   Plan: Problem List Items Addressed This Visit   None Visit Diagnoses     Abnormal uterine bleeding    -  Primary   Initiation of oral contraception           1) AUB - management options discussed given no uterine structural abnormalities and normal  labs most likely diagnosis in perimenopausal dysfunctional uterine bleeding.  Expectant, hormonal including IUD, endometrial ablation, or hysterectomy.  Non- hormonal options such as Lysteda and Ponstel also discussed. - Start Slynd  2) Telephone time 10:35min  3) Return in about 3 months (around 11/03/2020) for medication follow up.    Vena Austria, MD, [facility], [facility] 08/03/2020, 11:19 AM

## 2020-08-07 ENCOUNTER — Ambulatory Visit
Admission: EM | Admit: 2020-08-07 | Discharge: 2020-08-07 | Disposition: A | Discharge status: Home / Self Care | Payer: Federal, State, Local not specified - PPO | Attending: Family Medicine | Admitting: Family Medicine

## 2020-08-07 ENCOUNTER — Other Ambulatory Visit: Payer: Self-pay

## 2020-08-07 ENCOUNTER — Other Ambulatory Visit: Payer: Federal, State, Local not specified - PPO

## 2020-08-07 DIAGNOSIS — U071 COVID-19: Secondary | ICD-10-CM | POA: Diagnosis not present

## 2020-08-07 MED ORDER — ONDANSETRON 4 MG PO TBDP: 4.0000 mg | ORAL_TABLET | Freq: Three times a day (TID) | ORAL | 0 refills | Status: DC | PRN

## 2020-08-07 MED ORDER — PROMETHAZINE-DM 6.25-15 MG/5ML PO SYRP: 5.0000 mL | ORAL_SOLUTION | Freq: Four times a day (QID) | ORAL | 0 refills | Status: DC | PRN

## 2020-08-07 NOTE — Discharge Instructions (Addendum)
Rest.   Fluids.  Medication as directed.  Take care  Dr. Yader Criger  

## 2020-08-07 NOTE — ED Triage Notes (Signed)
Patient complains of headache, cough and nasal congestion/pressure. States that she has been having symptoms since Friday. States that she took a home test yesterday and was positive. States that she a needs a PCR test for work.

## 2020-08-08 LAB — SARS CORONAVIRUS 2 (TAT 6-24 HRS): SARS Coronavirus 2: POSITIVE — AB

## 2020-08-08 NOTE — ED Provider Notes (Signed)
[facility]    CSN: [id number] Arrival date & time: 08/07/20  1231  History   Chief Complaint Chief Complaint  Patient presents with   Headache   Covid Positive    HPI 45 year old female presents with the above complaints.   Patient reports her symptoms started on Friday.  She states that she has had nausea, headache, cough, congestion.  Has recently taken a home COVID test and it was positive.  Requesting PCR testing for work.  No documented fever.  No relieving factors.  No other associated symptoms.    Past Medical History:  Diagnosis Date   ADD (attention deficit disorder)    Anemia    B12 deficiency    GERD (gastroesophageal reflux disease)    Low iron    Migraine    Simple febrile convulsions (HCC)     Patient Active Problem List   Diagnosis Date Noted   Folate deficiency 03/03/2020   Iron deficiency anemia following bariatric surgery 10/18/2019   B12 deficiency 10/18/2019    Past Surgical History:  Procedure Laterality Date   ESOPHAGOGASTRODUODENOSCOPY (EGD) WITH PROPOFOL N/A 11/07/2019   Procedure: ESOPHAGOGASTRODUODENOSCOPY (EGD) WITH PROPOFOL;  Surgeon: Regis Bill, MD;  Location: [facility] ENDOSCOPY;  Service: Endoscopy;  Laterality: N/A;   HIATAL HERNIA REPAIR     LAPAROSCOPIC REVISION OF ROUXENY WITH UPPER ENDOSCOPY     linx management system      OB History     Gravida  4   Para      Term      Preterm      AB      Living  4      SAB      IAB      Ectopic      Multiple      Live Births               Home Medications    Prior to Admission medications   Medication Sig Start Date End Date Taking? Authorizing Provider  Butalbital-APAP-Caffeine 50-325-40 MG capsule Take by mouth.   Yes [provider]  cyanocobalamin (,VITAMIN B-12,) 1000 MCG/ML injection Inject 1 mL (1,000 mcg total) into the muscle every 30 (thirty) days. Administer weekly x 3 and then monthly. 03/07/20  Yes Corcoran, Ferdie Ping, MD   Drospirenone (SLYND) 4 MG TABS Take 1 tablet by mouth daily. 08/03/20  Yes Vena Austria, MD  folic acid (FOLVITE) 1 MG tablet Take 1 tablet (1 mg total) by mouth daily. 02/02/20  Yes Corcoran, Ferdie Ping, MD  hydrochlorothiazide (HYDRODIURIL) 12.5 MG tablet Take 12.5 mg by mouth daily. 07/26/20  Yes [provider]  LORazepam (ATIVAN) 0.5 MG tablet Take 0.5 mg by mouth 2 (two) times daily as needed. 10/28/19  Yes [provider]  mirtazapine (REMERON) 15 MG tablet Take 15 mg by mouth at bedtime. 06/18/20  Yes [provider]  Multiple Vitamin (MULTI-VITAMINS) TABS Take by mouth.   Yes [provider]  ondansetron (ZOFRAN ODT) 4 MG disintegrating tablet Take 1 tablet (4 mg total) by mouth every 8 (eight) hours as needed for nausea or vomiting. 08/07/20  Yes Jessen Siegman G, DO  pantoprazole (PROTONIX) 40 MG tablet Take by mouth.   Yes [provider]  promethazine-dextromethorphan (PROMETHAZINE-DM) 6.25-15 MG/5ML syrup Take 5 mLs by mouth 4 (four) times daily as needed for cough. 08/07/20  Yes Meiah Zamudio G, DO  SUMAtriptan (IMITREX) 100 MG tablet Take by mouth.   Yes [provider]  VYVANSE 70 MG capsule TAKE 1 CAPSULE BY MOUTH IN THE MORNING ONCE DAILY FOR 30 DAYS 11/26/17  Yes [provider]  zolpidem (AMBIEN) 10 MG tablet Take by mouth.   Yes [provider]    Family History Family History  Problem Relation Age of Onset   Thyroid disease Mother    Diabetes Mother    Hyperlipidemia Father    Lung cancer Father    Brain cancer Father     Social History Social History   Tobacco Use   Smoking status: Never   Smokeless tobacco: Never  Vaping Use   Vaping Use: Never used  Substance Use Topics   Alcohol use: Yes    Comment: occasionally   Drug use: Never     Allergies   Patient has no known allergies.   Review of Systems Review of Systems Per HPI  Physical Exam Triage Vital Signs ED Triage Vitals  Enc  Vitals Group     BP 08/07/20 1454 115/81     Pulse Rate 08/07/20 1454 (!) 108     Resp 08/07/20 1454 16     Temp 08/07/20 1454 98.9 F (37.2 C)     Temp Source 08/07/20 1454 Oral     SpO2 08/07/20 1454 100 %     Weight 08/07/20 1451 138 lb 14.2 oz (63 kg)     Height 08/07/20 1451 5\' 6"  (1.676 m)     Head Circumference --      Peak Flow --      Pain Score 08/07/20 1451 7     Pain Loc --      Pain Edu? --      Excl. in GC? --    No data found.  Updated Vital Signs BP 115/81 (BP Location: Left Arm)   Pulse (!) 108   Temp 98.9 F (37.2 C) (Oral)   Resp 16   Ht 5\' 6"  (1.676 m)   Wt 63 kg   LMP 07/18/2020   SpO2 100%   BMI 22.42 kg/m   Visual Acuity Right Eye Distance:   Left Eye Distance:   Bilateral Distance:    Right Eye Near:   Left Eye Near:    Bilateral Near:     Physical Exam Constitutional:      General: She is not in acute distress.    Appearance: She is well-developed. She is not ill-appearing.  HENT:     Head: Normocephalic and atraumatic.     Right Ear: Tympanic membrane normal.     Left Ear: Tympanic membrane normal.     Mouth/Throat:     Pharynx: Oropharynx is clear.  Eyes:     General:        Right eye: No discharge.        Left eye: No discharge.     Conjunctiva/sclera: Conjunctivae normal.  Cardiovascular:     Rate and Rhythm: Normal rate and regular rhythm.  Pulmonary:     Effort: Pulmonary effort is normal.     Breath sounds: Normal breath sounds.  Neurological:     Mental Status: She is alert.     UC Treatments / Results  Labs (all labs ordered are listed, but only abnormal results are displayed) Labs Reviewed  SARS CORONAVIRUS 2 (TAT 6-24 HRS) - Abnormal; Notable for the following components:      Result Value   SARS Coronavirus 2 POSITIVE (*)    All other components within normal limits  EKG   Radiology No results found.  Procedures Procedures (including critical care time)  Medications Ordered in UC Medications -  No data to display  Initial Impression / Assessment and Plan / UC Course  I have reviewed the triage vital signs and the nursing notes.  Pertinent labs & imaging results that were available during my care of the patient were reviewed by me and considered in my medical decision making (see chart for details).    45 year old female presents with a COVID-19.  She was doing well at this time.  We discussed antiviral treatment.  We both elected to hold off on treatment as she is on day 5 of symptoms and is doing well clinically.  Treating with supportive care, promethazine DM and Zofran.   Final Clinical Impressions(s) / UC Diagnoses   Final diagnoses:  COVID     Discharge Instructions      Rest. Fluids.  Medication as directed.  Take care  Dr. Adriana Simas    ED Prescriptions     Medication Sig Dispense Auth. Provider   promethazine-dextromethorphan (PROMETHAZINE-DM) 6.25-15 MG/5ML syrup Take 5 mLs by mouth 4 (four) times daily as needed for cough. 118 mL Aubryn Spinola G, DO   ondansetron (ZOFRAN ODT) 4 MG disintegrating tablet Take 1 tablet (4 mg total) by mouth every 8 (eight) hours as needed for nausea or vomiting. 20 tablet Tommie Sams, DO      PDMP not reviewed this encounter.   Tommie Sams, [place] 08/08/20 1709

## 2020-08-27 ENCOUNTER — Other Ambulatory Visit: Payer: Federal, State, Local not specified - PPO

## 2020-08-28 ENCOUNTER — Ambulatory Visit: Payer: Federal, State, Local not specified - PPO | Admitting: Nurse Practitioner

## 2020-08-28 ENCOUNTER — Ambulatory Visit: Payer: Federal, State, Local not specified - PPO

## 2020-08-29 ENCOUNTER — Other Ambulatory Visit: Payer: Self-pay

## 2020-08-29 ENCOUNTER — Inpatient Hospital Stay: Payer: Federal, State, Local not specified - PPO | Attending: Oncology

## 2020-08-29 DIAGNOSIS — K9589 Other complications of other bariatric procedure: Secondary | ICD-10-CM

## 2020-08-29 DIAGNOSIS — Z79899 Other long term (current) drug therapy: Secondary | ICD-10-CM | POA: Diagnosis not present

## 2020-08-29 DIAGNOSIS — D509 Iron deficiency anemia, unspecified: Secondary | ICD-10-CM | POA: Insufficient documentation

## 2020-08-29 DIAGNOSIS — N926 Irregular menstruation, unspecified: Secondary | ICD-10-CM

## 2020-08-29 DIAGNOSIS — E538 Deficiency of other specified B group vitamins: Secondary | ICD-10-CM | POA: Insufficient documentation

## 2020-08-29 DIAGNOSIS — D508 Other iron deficiency anemias: Secondary | ICD-10-CM

## 2020-08-29 DIAGNOSIS — Z9884 Bariatric surgery status: Secondary | ICD-10-CM | POA: Diagnosis not present

## 2020-08-29 LAB — COMPREHENSIVE METABOLIC PANEL
ALT: 15 U/L (ref 0–44)
AST: 18 U/L (ref 15–41)
Albumin: 4.1 g/dL (ref 3.5–5.0)
Alkaline Phosphatase: 62 U/L (ref 38–126)
Anion gap: 6 (ref 5–15)
BUN: 19 mg/dL (ref 6–20)
CO2: 27 mmol/L (ref 22–32)
Calcium: 8.4 mg/dL — ABNORMAL LOW (ref 8.9–10.3)
Chloride: 103 mmol/L (ref 98–111)
Creatinine, Ser: 0.88 mg/dL (ref 0.44–1.00)
GFR, Estimated: >60 mL/min (ref >60)
Glucose, Bld: 100 mg/dL — ABNORMAL HIGH (ref 70–99)
Potassium: 3.7 mmol/L (ref 3.5–5.1)
Sodium: 136 mmol/L (ref 135–145)
Total Bilirubin: 0.7 mg/dL (ref 0.3–1.2)
Total Protein: 6.9 g/dL (ref 6.5–8.1)

## 2020-08-29 LAB — CBC WITH DIFFERENTIAL/PLATELET
Abs Immature Granulocytes: 0.01 10*3/uL (ref 0.00–0.07)
Basophils Absolute: 0 10*3/uL (ref 0.0–0.1)
Basophils Relative: 0 %
Eosinophils Absolute: 0.1 10*3/uL (ref 0.0–0.5)
Eosinophils Relative: 2 %
HCT: 38.6 % (ref 36.0–46.0)
Hemoglobin: 13.4 g/dL (ref 12.0–15.0)
Immature Granulocytes: 0 %
Lymphocytes Relative: 43 %
Lymphs Abs: 2.1 10*3/uL (ref 0.7–4.0)
MCH: 31 pg (ref 26.0–34.0)
MCHC: 34.7 g/dL (ref 30.0–36.0)
MCV: 89.4 fL (ref 80.0–100.0)
Monocytes Absolute: 0.5 10*3/uL (ref 0.1–1.0)
Monocytes Relative: 10 %
Neutro Abs: 2.2 10*3/uL (ref 1.7–7.7)
Neutrophils Relative %: 45 %
Platelets: 231 10*3/uL (ref 150–400)
RBC: 4.32 MIL/uL (ref 3.87–5.11)
RDW: 12.7 % (ref 11.5–15.5)
WBC: 4.9 10*3/uL (ref 4.0–10.5)
nRBC: 0 % (ref 0.0–0.2)

## 2020-08-29 LAB — IRON AND TIBC
Iron: 96 ug/dL (ref 28–170)
Saturation Ratios: 27 % (ref 10.4–31.8)
TIBC: 363 ug/dL (ref 250–450)
UIBC: 267 ug/dL

## 2020-08-29 LAB — FOLATE: Folate: 15.4 ng/mL (ref >5.9)

## 2020-08-29 LAB — FERRITIN: Ferritin: 72 ng/mL (ref 11–307)

## 2020-08-29 LAB — VITAMIN B12: Vitamin B-12: 242 pg/mL (ref 180–914)

## 2020-08-30 ENCOUNTER — Ambulatory Visit: Payer: Federal, State, Local not specified - PPO

## 2020-08-30 ENCOUNTER — Ambulatory Visit: Payer: Federal, State, Local not specified - PPO | Admitting: Oncology

## 2020-09-06 ENCOUNTER — Other Ambulatory Visit: Payer: Self-pay

## 2020-09-06 ENCOUNTER — Ambulatory Visit: Payer: Federal, State, Local not specified - PPO

## 2020-09-06 ENCOUNTER — Inpatient Hospital Stay: Payer: Federal, State, Local not specified - PPO

## 2020-09-06 ENCOUNTER — Encounter: Payer: Self-pay | Admitting: Oncology

## 2020-09-06 ENCOUNTER — Inpatient Hospital Stay: Payer: Federal, State, Local not specified - PPO | Attending: Oncology | Admitting: Oncology

## 2020-09-06 VITALS — BP 124/93 | HR 111 | Temp 99.8°F | Resp 16 | Wt 141.9 lb

## 2020-09-06 DIAGNOSIS — Z801 Family history of malignant neoplasm of trachea, bronchus and lung: Secondary | ICD-10-CM | POA: Diagnosis not present

## 2020-09-06 DIAGNOSIS — K219 Gastro-esophageal reflux disease without esophagitis: Secondary | ICD-10-CM | POA: Insufficient documentation

## 2020-09-06 DIAGNOSIS — D509 Iron deficiency anemia, unspecified: Secondary | ICD-10-CM | POA: Insufficient documentation

## 2020-09-06 DIAGNOSIS — D508 Other iron deficiency anemias: Secondary | ICD-10-CM

## 2020-09-06 DIAGNOSIS — K9589 Other complications of other bariatric procedure: Secondary | ICD-10-CM | POA: Diagnosis not present

## 2020-09-06 DIAGNOSIS — Z9884 Bariatric surgery status: Secondary | ICD-10-CM | POA: Diagnosis not present

## 2020-09-06 DIAGNOSIS — Z79899 Other long term (current) drug therapy: Secondary | ICD-10-CM | POA: Diagnosis not present

## 2020-09-06 DIAGNOSIS — Z808 Family history of malignant neoplasm of other organs or systems: Secondary | ICD-10-CM | POA: Diagnosis not present

## 2020-09-06 DIAGNOSIS — J029 Acute pharyngitis, unspecified: Secondary | ICD-10-CM | POA: Diagnosis not present

## 2020-09-06 DIAGNOSIS — R221 Localized swelling, mass and lump, neck: Secondary | ICD-10-CM | POA: Diagnosis not present

## 2020-09-06 DIAGNOSIS — K449 Diaphragmatic hernia without obstruction or gangrene: Secondary | ICD-10-CM | POA: Diagnosis not present

## 2020-09-06 DIAGNOSIS — E538 Deficiency of other specified B group vitamins: Secondary | ICD-10-CM

## 2020-09-06 MED ORDER — CYANOCOBALAMIN 1000 MCG/ML IJ SOLN
1000.0000 ug | Freq: Once | INTRAMUSCULAR | Status: AC
  Administered 2020-09-06: 1000 ug via INTRAMUSCULAR

## 2020-09-06 NOTE — Progress Notes (Signed)
[facility]  [address] [place], [place] [address] Phone: [phone]  Fax: [phone]   Clinic Day:  09/06/2020  Referring physician: Evelene Croon, MD  Chief Complaint: Nichole Gentry is a 45 y.o. female s/p gastric bypass surgery presents for iron deficiency and Vitamin B12 deficiency.  PERTINENT HEMATOLOGY HISTORY   # s/p gastric bypass surgery with iron deficiency anemia.  She may have celiac disease.  She is intolerant of oral iron.  EGD in 2019 revealed a hiatal hernia, mildly dilated esophagus and dysmotility esophagus.  There were fundic gland polyps.  Duodenal biopsies revealed some increase in epithelial lymphocytes.  Colonoscopy on 04/22/2019 revealed only hemorrhoids.  She is on a gluten-free diet.  EGD on 11/07/2019 revealed salmon-colored mucosa suspicious for short-segment Barrett's esophagus. There was a small hiatal hernia. Roux-en-Y gastrojejunostomy with gastrojejunal anastomosis characterized by healthy appearing mucosa. Stomach and esophagus biopsies showed chronic inflammation. There was no intestinal metaplasia, dysplasia, and malignancy.  Patient reports feeling extremely tired today.  She missed her appointment for vitamin B12 injection last months.  She had close contact with a friend who was recently diagnosed with strep.  Patient reports some scratchy sore throat and swelling of the left neck.  No fever, chills, she took and COVID testing at home and was negative.      Past Medical History:  Diagnosis Date   ADD (attention deficit disorder)    Anemia    B12 deficiency    GERD (gastroesophageal reflux disease)    Low iron    Migraine    Simple febrile convulsions (HCC)     Past Surgical History:  Procedure Laterality Date   ESOPHAGOGASTRODUODENOSCOPY (EGD) WITH PROPOFOL N/A 11/07/2019   Procedure: ESOPHAGOGASTRODUODENOSCOPY (EGD) WITH PROPOFOL;  Surgeon: Regis Bill, MD;  Location: [facility] ENDOSCOPY;   Service: Endoscopy;  Laterality: N/A;   HIATAL HERNIA REPAIR     LAPAROSCOPIC REVISION OF ROUXENY WITH UPPER ENDOSCOPY     linx management system      Family History  Problem Relation Age of Onset   Thyroid disease Mother    Diabetes Mother    Hyperlipidemia Father    Lung cancer Father    Brain cancer Father     Social History:  reports that she has never smoked. She has never used smokeless tobacco. She reports current alcohol use. She reports that she does not use drugs. The patient is alone today.  Allergies: No Known Allergies  Current Medications: Current Outpatient Medications  Medication Sig Dispense Refill   Butalbital-APAP-Caffeine 50-325-40 MG capsule Take by mouth.     VYVANSE 70 MG capsule TAKE 1 CAPSULE BY MOUTH IN THE MORNING ONCE DAILY FOR 30 DAYS  0   zolpidem (AMBIEN) 10 MG tablet Take by mouth.     cyanocobalamin (,VITAMIN B-12,) 1000 MCG/ML injection Inject 1 mL (1,000 mcg total) into the muscle every 30 (thirty) days. Administer weekly x 3 and then monthly. 1 mL 1   Drospirenone (SLYND) 4 MG TABS Take 1 tablet by mouth daily. 84 tablet 3   fluconazole (DIFLUCAN) 150 MG tablet Take 150 mg by mouth once.     folic acid (FOLVITE) 1 MG tablet Take 1 tablet (1 mg total) by mouth daily. 30 tablet 5   hydrochlorothiazide (HYDRODIURIL) 12.5 MG tablet Take 12.5 mg by mouth daily.     LORazepam (ATIVAN) 0.5 MG tablet Take 0.5 mg by mouth 2 (two) times daily as needed.     mirtazapine (REMERON) 15  MG tablet Take 15 mg by mouth at bedtime.     Multiple Vitamin (MULTI-VITAMINS) TABS Take by mouth.     ondansetron (ZOFRAN ODT) 4 MG disintegrating tablet Take 1 tablet (4 mg total) by mouth every 8 (eight) hours as needed for nausea or vomiting. 20 tablet 0   pantoprazole (PROTONIX) 40 MG tablet Take by mouth.     promethazine-dextromethorphan (PROMETHAZINE-DM) 6.25-15 MG/5ML syrup Take 5 mLs by mouth 4 (four) times daily as needed for cough. 118 mL 0   SUMAtriptan (IMITREX)  100 MG tablet Take by mouth.     tranexamic acid (LYSTEDA) 650 MG TABS tablet Take 325 mg by mouth 2 (two) times daily.     No current facility-administered medications for this visit.    Review of Systems  Constitutional:  Positive for malaise/fatigue. Negative for fever and weight loss. Diaphoresis: occasional. HENT:  Positive for sore throat. Negative for congestion, ear discharge, ear pain, hearing loss, nosebleeds, sinus pain and tinnitus.   Eyes:  Negative for pain.  Respiratory:  Negative for cough, hemoptysis, sputum production and shortness of breath.   Cardiovascular:  Negative for chest pain, palpitations and leg swelling.  Gastrointestinal:  Negative for abdominal pain, blood in stool, constipation, diarrhea, melena and vomiting.  Genitourinary:  Negative for dysuria, frequency, hematuria and urgency.  Musculoskeletal:  Negative for back pain, joint pain and neck pain.       Purple toe  Skin:  Negative for itching and rash.  Neurological:  Positive for headaches (occasional). Negative for tingling, sensory change and weakness.       Reports restless legs  Endo/Heme/Allergies:  Does not bruise/bleed easily.       Always cold.  Psychiatric/Behavioral:  Negative for depression and memory loss. The patient is not nervous/anxious and does not have insomnia.   All other systems reviewed and are negative.    Vitals Blood pressure (!) 124/93, pulse (!) 111, temperature 99.8 F (37.7 C), resp. rate 16, weight 141 lb 13.9 oz (64.4 kg), SpO2 98 %.  Performance status (ECOG): 0 Physical Exam Vitals and nursing note reviewed.  Constitutional:      General: She is not in acute distress.    Appearance: Normal appearance. She is well-developed.     Interventions: Face mask in place.  HENT:     Head: Normocephalic and atraumatic.     Mouth/Throat:     Mouth: Mucous membranes are moist. No oral lesions.  Eyes:     Pupils: Pupils are equal, round, and reactive to light.   Cardiovascular:     Rate and Rhythm: Normal rate and regular rhythm.     Heart sounds: Normal heart sounds. No murmur heard.   No friction rub. No gallop.  Pulmonary:     Effort: Pulmonary effort is normal. No respiratory distress.     Breath sounds: Normal breath sounds. No wheezing, rhonchi or rales.  Chest:  Breasts:    Right: No axillary adenopathy or supraclavicular adenopathy.     Left: No axillary adenopathy or supraclavicular adenopathy.  Abdominal:     General: Bowel sounds are normal.     Palpations: Abdomen is soft. There is no hepatomegaly, splenomegaly or mass.     Tenderness: There is no abdominal tenderness. There is no guarding or rebound.  Musculoskeletal:        General: No tenderness. Normal range of motion.     Cervical back: Normal range of motion and neck supple.  Lymphadenopathy:     Head:  Right side of head: No preauricular, posterior auricular or occipital adenopathy.     Left side of head: No preauricular, posterior auricular or occipital adenopathy.     Cervical: No cervical adenopathy.     Upper Body:     Right upper body: No supraclavicular or axillary adenopathy.     Left upper body: No supraclavicular or axillary adenopathy.     Lower Body: No right inguinal adenopathy. No left inguinal adenopathy.  Skin:    General: Skin is warm and dry.     Findings: No bruising, erythema, lesion or rash.  Neurological:     Mental Status: She is alert and oriented to person, place, and time.   No visits with results within 3 Day(s) from this visit.  Latest known visit with results is:  Appointment on 08/29/2020  Component Date Value Ref Range Status   Vitamin B-12 08/29/2020 242  180 - 914 pg/mL Final   Comment: (NOTE) This assay is not validated for testing neonatal or myeloproliferative syndrome specimens for Vitamin B12 levels. Performed at [facility], [address], [place], [place] [address]    Iron 08/29/2020 96  28 - 170 ug/dL Final    TIBC 60/11/9321 363  250 - 450 ug/dL Final   Saturation Ratios 08/29/2020 27  10.4 - 31.8 % Final   UIBC 08/29/2020 267  ug/dL Final   Performed at [facility], [address] [place], [place] [address]   Ferritin 08/29/2020 72  11 - 307 ng/mL Final   Performed at [facility], [address] [place], [place] [address]   Folate 08/29/2020 15.4  >5.9 ng/mL Final   Performed at [facility], [address] [place], [place] [address]   Sodium 08/29/2020 136  135 - 145 mmol/L Final   Potassium 08/29/2020 3.7  3.5 - 5.1 mmol/L Final   Chloride 08/29/2020 103  98 - 111 mmol/L Final   CO2 08/29/2020 27  22 - 32 mmol/L Final   Glucose, Bld 08/29/2020 100 (A) 70 - 99 mg/dL Final   Glucose reference range applies only to samples taken after fasting for at least 8 hours.   BUN 08/29/2020 19  6 - 20 mg/dL Final   Creatinine, Ser 08/29/2020 0.88  0.44 - 1.00 mg/dL Final   Calcium 37/62/8315 8.4 (A) 8.9 - 10.3 mg/dL Final   Total Protein 17/61/6073 6.9  6.5 - 8.1 g/dL Final   Albumin 71/07/2692 4.1  3.5 - 5.0 g/dL Final   AST 85/46/2703 18  15 - 41 U/L Final   ALT 08/29/2020 15  0 - 44 U/L Final   Alkaline Phosphatase 08/29/2020 62  38 - 126 U/L Final   Total Bilirubin 08/29/2020 0.7  0.3 - 1.2 mg/dL Final   GFR, Estimated 08/29/2020 >60  >60 mL/min Final   Comment: (NOTE) Calculated using the CKD-EPI Creatinine Equation (2021)    Anion gap 08/29/2020 6  5 - 15 Final   Performed at [facility], [address] [place], [place] [address]   WBC 08/29/2020 4.9  4.0 - 10.5 K/uL Final   RBC 08/29/2020 4.32  3.87 - 5.11 MIL/uL Final   Hemoglobin 08/29/2020 13.4  12.0 - 15.0 g/dL Final   HCT 81/82/9937 38.6  36.0 - 46.0 % Final   MCV 08/29/2020 89.4  80.0 - 100.0 fL Final   MCH 08/29/2020 31.0  26.0 - 34.0 pg Final   MCHC 08/29/2020 34.7  30.0 - 36.0 g/dL Final  RDW 08/29/2020 12.7  11.5 - 15.5 % Final   Platelets 08/29/2020 231  150 - 400 K/uL Final    nRBC 08/29/2020 0.0  0.0 - 0.2 % Final   Neutrophils Relative % 08/29/2020 45  % Final   Neutro Abs 08/29/2020 2.2  1.7 - 7.7 K/uL Final   Lymphocytes Relative 08/29/2020 43  % Final   Lymphs Abs 08/29/2020 2.1  0.7 - 4.0 K/uL Final   Monocytes Relative 08/29/2020 10  % Final   Monocytes Absolute 08/29/2020 0.5  0.1 - 1.0 K/uL Final   Eosinophils Relative 08/29/2020 2  % Final   Eosinophils Absolute 08/29/2020 0.1  0.0 - 0.5 K/uL Final   Basophils Relative 08/29/2020 0  % Final   Basophils Absolute 08/29/2020 0.0  0.0 - 0.1 K/uL Final   Immature Granulocytes 08/29/2020 0  % Final   Abs Immature Granulocytes 08/29/2020 0.01  0.00 - 0.07 K/uL Final   Performed at [facility], [address] [place], [place] [id number]    Assessment and Plan  1. Iron deficiency anemia following bariatric surgery   2. B12 deficiency   3. Folate deficiency   4. History of gastric bypass    #Iron deficiency anemia in the context of gastric bypass Labs reviewed and discussed with patient. Hemoglobin remained stable today at 13.4.  Ferritin 72, iron saturation 27.  No need for IV Venofer treatments.  #History of vitamin B12 deficiency. B12 level is at low normal end.  Recommend patient to continue monthly parenteral vitamin B12 injection 1000 MCG.  Proceed with B12 today.  #Folate deficiency, folate level is 15.4 today.  Continue folic acid 1 mg daily.  #Sore throat, recent sick contact. She is afebrile.  Encourage rest and oral hydration.  If symptoms get worse, call primary care provider for further evaluation.  RTC in 6 months for MD assessment, labs (CBC with diff, ferritin, iron studies, Fo[place], B12- day before) and +/- Venofer.+ B12  I discussed the assessment and treatment plan with the patient.  The patient was provided an opportunity to ask questions and all were answered.  The patient agreed with the plan and demonstrated an understanding of the instructions.  The patient was  advised to call back if the symptoms worsen or if the condition fails to improve as anticipated.  Rickard PatienceZhou Aizley Stenseth, MD, PhD Hematology Oncology [facility] 09/06/2020

## 2020-09-06 NOTE — Progress Notes (Signed)
Pt states she had noticed swelling in her legs and feet since Tuesday afternoon. At one point, her toes were purple causing pain in lower legs.Pt states its been over a month since her b12 inj, the lady who adminsteres for her retired. No one showed her how to adminster on her own. Also, pt states she has a sore throat but administered a home test and it was negative.

## 2020-09-20 ENCOUNTER — Ambulatory Visit: Payer: Federal, State, Local not specified - PPO

## 2020-09-20 ENCOUNTER — Inpatient Hospital Stay: Payer: Federal, State, Local not specified - PPO | Admitting: Oncology

## 2020-10-10 ENCOUNTER — Inpatient Hospital Stay: Payer: Federal, State, Local not specified - PPO | Attending: Family Medicine

## 2020-11-07 ENCOUNTER — Other Ambulatory Visit: Payer: Self-pay

## 2020-11-07 ENCOUNTER — Inpatient Hospital Stay: Payer: Federal, State, Local not specified - PPO | Attending: Oncology

## 2020-11-22 ENCOUNTER — Encounter: Payer: Self-pay | Admitting: Oncology

## 2020-11-22 NOTE — Telephone Encounter (Signed)
04/16/20 233p  Called patient to infrom her to continue vit B 12 injections monthly per MD since patient has already received injec weekly X 4. patient aware of future apts, all questions answered. Verbalizes understanding

## 2020-11-26 ENCOUNTER — Encounter: Payer: Self-pay | Admitting: Oncology

## 2020-11-26 NOTE — Telephone Encounter (Signed)
Error in charting.

## 2020-12-12 ENCOUNTER — Other Ambulatory Visit: Payer: Self-pay

## 2020-12-12 ENCOUNTER — Inpatient Hospital Stay: Payer: Federal, State, Local not specified - PPO | Attending: Oncology

## 2021-01-09 ENCOUNTER — Inpatient Hospital Stay: Payer: Federal, State, Local not specified - PPO | Attending: Oncology

## 2021-02-06 ENCOUNTER — Inpatient Hospital Stay: Payer: Federal, State, Local not specified - PPO | Attending: Oncology

## 2021-03-05 ENCOUNTER — Other Ambulatory Visit: Payer: Self-pay | Admitting: *Deleted

## 2021-03-05 DIAGNOSIS — D508 Other iron deficiency anemias: Secondary | ICD-10-CM

## 2021-03-05 DIAGNOSIS — K9589 Other complications of other bariatric procedure: Secondary | ICD-10-CM

## 2021-03-13 ENCOUNTER — Inpatient Hospital Stay: Payer: Federal, State, Local not specified - PPO | Attending: Oncology

## 2021-03-13 ENCOUNTER — Other Ambulatory Visit: Payer: Self-pay

## 2021-03-13 DIAGNOSIS — K219 Gastro-esophageal reflux disease without esophagitis: Secondary | ICD-10-CM | POA: Diagnosis not present

## 2021-03-13 DIAGNOSIS — Z9884 Bariatric surgery status: Secondary | ICD-10-CM | POA: Diagnosis not present

## 2021-03-13 DIAGNOSIS — E538 Deficiency of other specified B group vitamins: Secondary | ICD-10-CM

## 2021-03-13 DIAGNOSIS — K9589 Other complications of other bariatric procedure: Secondary | ICD-10-CM

## 2021-03-13 DIAGNOSIS — Z79899 Other long term (current) drug therapy: Secondary | ICD-10-CM | POA: Diagnosis not present

## 2021-03-13 DIAGNOSIS — K449 Diaphragmatic hernia without obstruction or gangrene: Secondary | ICD-10-CM | POA: Insufficient documentation

## 2021-03-13 DIAGNOSIS — Z800 Family history of malignant neoplasm of digestive organs: Secondary | ICD-10-CM | POA: Diagnosis not present

## 2021-03-13 DIAGNOSIS — Z801 Family history of malignant neoplasm of trachea, bronchus and lung: Secondary | ICD-10-CM | POA: Insufficient documentation

## 2021-03-13 DIAGNOSIS — D509 Iron deficiency anemia, unspecified: Secondary | ICD-10-CM | POA: Diagnosis not present

## 2021-03-13 DIAGNOSIS — D508 Other iron deficiency anemias: Secondary | ICD-10-CM

## 2021-03-13 LAB — VITAMIN B12: Vitamin B-12: 267 pg/mL (ref 180–914)

## 2021-03-13 LAB — CBC WITH DIFFERENTIAL/PLATELET
Abs Immature Granulocytes: 0.01 10*3/uL (ref 0.00–0.07)
Basophils Absolute: 0 10*3/uL (ref 0.0–0.1)
Basophils Relative: 1 %
Eosinophils Absolute: 0.1 10*3/uL (ref 0.0–0.5)
Eosinophils Relative: 2 %
HCT: 39.1 % (ref 36.0–46.0)
Hemoglobin: 13.2 g/dL (ref 12.0–15.0)
Immature Granulocytes: 0 %
Lymphocytes Relative: 51 %
Lymphs Abs: 2.1 10*3/uL (ref 0.7–4.0)
MCH: 30.8 pg (ref 26.0–34.0)
MCHC: 33.8 g/dL (ref 30.0–36.0)
MCV: 91.4 fL (ref 80.0–100.0)
Monocytes Absolute: 0.3 10*3/uL (ref 0.1–1.0)
Monocytes Relative: 8 %
Neutro Abs: 1.6 10*3/uL — ABNORMAL LOW (ref 1.7–7.7)
Neutrophils Relative %: 38 %
Platelets: 226 10*3/uL (ref 150–400)
RBC: 4.28 MIL/uL (ref 3.87–5.11)
RDW: 11.8 % (ref 11.5–15.5)
WBC: 4.1 10*3/uL (ref 4.0–10.5)
nRBC: 0 % (ref 0.0–0.2)

## 2021-03-13 LAB — IRON AND TIBC
Iron: 124 ug/dL (ref 28–170)
Saturation Ratios: 33 % — ABNORMAL HIGH (ref 10.4–31.8)
TIBC: 372 ug/dL (ref 250–450)
UIBC: 248 ug/dL

## 2021-03-13 LAB — FERRITIN: Ferritin: 32 ng/mL (ref 11–307)

## 2021-03-13 LAB — FOLATE: Folate: 11.9 ng/mL (ref >5.9)

## 2021-03-14 ENCOUNTER — Encounter: Payer: Self-pay | Admitting: Nurse Practitioner

## 2021-03-14 ENCOUNTER — Inpatient Hospital Stay (HOSPITAL_BASED_OUTPATIENT_CLINIC_OR_DEPARTMENT_OTHER): Payer: Federal, State, Local not specified - PPO | Admitting: Nurse Practitioner

## 2021-03-14 ENCOUNTER — Inpatient Hospital Stay: Payer: Federal, State, Local not specified - PPO

## 2021-03-14 ENCOUNTER — Inpatient Hospital Stay: Payer: Federal, State, Local not specified - PPO | Admitting: Oncology

## 2021-03-14 VITALS — BP 116/78 | HR 88 | Temp 96.7°F | Resp 18 | Wt 143.0 lb

## 2021-03-14 DIAGNOSIS — K9589 Other complications of other bariatric procedure: Secondary | ICD-10-CM | POA: Diagnosis not present

## 2021-03-14 DIAGNOSIS — E538 Deficiency of other specified B group vitamins: Secondary | ICD-10-CM | POA: Diagnosis not present

## 2021-03-14 DIAGNOSIS — D508 Other iron deficiency anemias: Secondary | ICD-10-CM

## 2021-03-14 DIAGNOSIS — D509 Iron deficiency anemia, unspecified: Secondary | ICD-10-CM | POA: Diagnosis not present

## 2021-03-14 MED ORDER — CYANOCOBALAMIN 1000 MCG/ML IJ SOLN: 1000.0000 ug | INTRAMUSCULAR | 4 refills | Status: DC

## 2021-03-14 MED ORDER — "BD SAFETYGLIDE SYRINGE/NEEDLE 25G X 1"" 3 ML MISC": 0 refills | Status: DC

## 2021-03-14 MED ORDER — CYANOCOBALAMIN 1000 MCG/ML IJ SOLN
1000.0000 ug | Freq: Once | INTRAMUSCULAR | Status: AC
  Administered 2021-03-14: 1000 ug via INTRAMUSCULAR
  Filled 2021-03-14: qty 1

## 2021-03-14 NOTE — Progress Notes (Signed)
[place], [place] Phone: [phone]   Clinic Day:  03/14/2021  Referring physician: Lorelee Market, MD  Chief Complaint: Nichole Gentry is a 46 y.o. female s/p gastric bypass surgery presents for iron deficiency and Vitamin B12 deficiency.  PERTINENT HEMATOLOGY HISTORY   # s/p gastric bypass surgery with iron deficiency anemia.  She may have celiac disease.  She is intolerant of oral iron.  EGD in 2019 revealed a hiatal hernia, mildly dilated esophagus and dysmotility esophagus.  There were fundic gland polyps.  Duodenal biopsies revealed some increase in epithelial lymphocytes.  Colonoscopy on 04/22/2019 revealed only hemorrhoids.  She is on a gluten-free diet.  EGD on 11/07/2019 revealed salmon-colored mucosa suspicious for short-segment Barrett's esophagus. There was a small hiatal hernia. Roux-en-Y gastrojejunostomy with gastrojejunal anastomosis characterized by healthy appearing mucosa. Stomach and esophagus biopsies showed chronic inflammation. There was no intestinal metaplasia, dysplasia, and malignancy.  Patient reports feeling extremely tired today.  She missed her appointment for vitamin B12 injection last months.  She had close contact with a friend who was recently diagnosed with strep.  Patient reports some scratchy sore throat and swelling of the left neck.  No fever, chills, she took and COVID testing at home and was negative.  Interval History: Patient is 46 year old female who returns to clinic for follow up and possible IV iron. Feels well and denies complaints. No black or bloody stools. Has not been taking b12.     Past Medical History:  Diagnosis Date   ADD (attention deficit disorder)    Anemia    B12 deficiency    GERD (gastroesophageal reflux disease)    Low iron    Migraine    Simple febrile convulsions ([place])     Past Surgical History:  Procedure Laterality Date   ESOPHAGOGASTRODUODENOSCOPY (EGD) WITH PROPOFOL N/A 11/07/2019    Procedure: ESOPHAGOGASTRODUODENOSCOPY (EGD) WITH PROPOFOL;  Surgeon: Lesly Rubenstein, MD;  Location: [facility] ENDOSCOPY;  Service: Endoscopy;  Laterality: N/A;   HIATAL HERNIA REPAIR     LAPAROSCOPIC REVISION OF ROUXENY WITH UPPER ENDOSCOPY     linx management system      Family History  Problem Relation Age of Onset   Thyroid disease Mother    Diabetes Mother    Hyperlipidemia Father    Lung cancer Father    Brain cancer Father     Social History:  reports that she has never smoked. She has never used smokeless tobacco. She reports current alcohol use. She reports that she does not use drugs. The patient is alone today.  Allergies: No Known Allergies  Current Medications: Current Outpatient Medications  Medication Sig Dispense Refill   Butalbital-APAP-Caffeine 50-325-40 MG capsule Take by mouth.     cyanocobalamin (,VITAMIN B-12,) 1000 MCG/ML injection Inject 1 mL (1,000 mcg total) into the muscle every 30 (thirty) days. Administer weekly x 3 and then monthly. 1 mL 1   folic acid (FOLVITE) 1 MG tablet Take 1 tablet (1 mg total) by mouth daily. 30 tablet 5   hydrochlorothiazide (HYDRODIURIL) 12.5 MG tablet Take 12.5 mg by mouth daily.     LORazepam (ATIVAN) 0.5 MG tablet Take 0.5 mg by mouth 2 (two) times daily as needed.     mirtazapine (REMERON) 15 MG tablet Take 15 mg by mouth at bedtime.     Multiple Vitamin (MULTI-VITAMINS) TABS Take by mouth.     pantoprazole (PROTONIX) 40 MG tablet Take by mouth.     promethazine-dextromethorphan (PROMETHAZINE-DM) 6.25-15 MG/5ML  syrup Take 5 mLs by mouth 4 (four) times daily as needed for cough. 118 mL 0   SUMAtriptan (IMITREX) 100 MG tablet Take by mouth.     tranexamic acid (LYSTEDA) 650 MG TABS tablet Take 325 mg by mouth 2 (two) times daily.     VYVANSE 70 MG capsule TAKE 1 CAPSULE BY MOUTH IN THE MORNING ONCE DAILY FOR 30 DAYS  0   zolpidem (AMBIEN) 10 MG tablet Take by mouth.     Drospirenone (SLYND) 4 MG TABS Take 1 tablet by mouth  daily. (Patient not taking: Reported on 03/14/2021) 84 tablet 3   fluconazole (DIFLUCAN) 150 MG tablet Take 150 mg by mouth once.     ondansetron (ZOFRAN ODT) 4 MG disintegrating tablet Take 1 tablet (4 mg total) by mouth every 8 (eight) hours as needed for nausea or vomiting. (Patient not taking: Reported on 03/14/2021) 20 tablet 0   No current facility-administered medications for this visit.    Review of Systems  Constitutional:  Negative for chills, fever, malaise/fatigue and weight loss.  HENT:  Negative for hearing loss, nosebleeds, sore throat and tinnitus.   Eyes:  Negative for blurred vision and double vision.  Respiratory:  Negative for cough, hemoptysis, shortness of breath and wheezing.   Cardiovascular:  Negative for chest pain, palpitations and leg swelling.  Gastrointestinal:  Negative for abdominal pain, blood in stool, constipation, diarrhea, melena, nausea and vomiting.  Genitourinary:  Negative for dysuria and urgency.  Musculoskeletal:  Negative for back pain, falls, joint pain and myalgias.  Skin:  Negative for itching and rash.  Neurological:  Negative for dizziness, tingling, sensory change, loss of consciousness, weakness and headaches.  Endo/Heme/Allergies:  Negative for environmental allergies. Does not bruise/bleed easily.  Psychiatric/Behavioral:  Negative for depression. The patient is not nervous/anxious and does not have insomnia.    Vitals Blood pressure 116/78, pulse 88, temperature (!) 96.7 F (35.9 C), temperature source Tympanic, resp. rate 18, weight 143 lb (64.9 kg), SpO2 98 %.   Performance status (ECOG): 0  Physical Exam Constitutional:      Appearance: She is not ill-appearing.  Pulmonary:     Effort: No respiratory distress.  Abdominal:     General: Bowel sounds are normal. There is no distension.     Tenderness: There is no abdominal tenderness.  Musculoskeletal:        General: No deformity.  Skin:    General: Skin is warm and dry.      Coloration: Skin is not pale.  Neurological:     Mental Status: She is alert and oriented to person, place, and time.  Psychiatric:        Mood and Affect: Mood normal.        Behavior: Behavior normal.   CBC EXTENDED Latest Ref Rng & Units 03/13/2021 08/29/2020 05/30/2020  WBC 4.0 - 10.5 K/uL 4.1 4.9 4.7  RBC 3.87 - 5.11 MIL/uL 4.28 4.32 4.53  HGB 12.0 - 15.0 g/dL 13.2 13.4 14.0  HCT 36.0 - 46.0 % 39.1 38.6 40.3  PLT 150 - 400 K/uL 226 231 252  NEUTROABS 1.7 - 7.7 K/uL 1.6(L) 2.2 2.5  LYMPHSABS 0.7 - 4.0 K/uL 2.1 2.1 1.6    Iron/TIBC/Ferritin/ %Sat    Component Value Date/Time   IRON 124 03/13/2021 0954   TIBC 372 03/13/2021 0954   FERRITIN 32 03/13/2021 0954   IRONPCTSAT 33 (H) 03/13/2021 0954   Component Ref Range & Units 1 d ago (03/13/21) 6 mo ago (08/29/20)  9 mo ago (05/30/20) 1 yr ago (02/02/20) 1 yr ago (10/18/19)  Vitamin B-12 180 - 914 pg/mL 267  242 CM  293 CM  169 Low  CM  >7,500 High      Assessment and Plan  No diagnosis found.  #Iron deficiency anemia in the context of gastric bypass- hemoglobin remains stable at 13.2. No microcytosis. Ferritin and iron studies are normal. She does not require IV iron.   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 Recommend home b12 injections monthly. She will receive b12 today with administration teaching by nursing. Reviewed that she can come next month for nurse visit and bring medications if needed.   #Folate deficiency, folate level is 11.9 today.  Continue folic acid 1 mg daily.  6 months - lab (cbc, ferritin, iron studies, folate, b12). Day to week later, see MD for results. [place] for virtual appointments if patient prefers.   I discussed the assessment and treatment plan with the patient.  The patient was provided an opportunity to ask questions and all were answered.  The patient agreed with the plan and demonstrated an understanding of the instructions.  The patient was advised to call back if the  symptoms worsen or if the condition fails to improve as anticipated.  Beckey Rutter, DNP, AGNP-C [place] at [facility] [phone] (clinic) 03/14/2021

## 2021-03-14 NOTE — Addendum Note (Signed)
Addended by: Suzan Slick on: 03/14/2021 02:14 PM   Modules accepted: Orders

## 2021-03-14 NOTE — Progress Notes (Signed)
No complaints

## 2021-09-11 ENCOUNTER — Inpatient Hospital Stay: Payer: Federal, State, Local not specified - PPO

## 2021-09-12 ENCOUNTER — Inpatient Hospital Stay: Payer: Federal, State, Local not specified - PPO | Admitting: Nurse Practitioner

## 2021-09-16 DIAGNOSIS — F909 Attention-deficit hyperactivity disorder, unspecified type: Secondary | ICD-10-CM | POA: Diagnosis not present

## 2021-09-16 DIAGNOSIS — F411 Generalized anxiety disorder: Secondary | ICD-10-CM | POA: Diagnosis not present

## 2021-09-18 ENCOUNTER — Inpatient Hospital Stay: Payer: Federal, State, Local not specified - PPO | Attending: Nurse Practitioner

## 2021-09-18 DIAGNOSIS — D508 Other iron deficiency anemias: Secondary | ICD-10-CM | POA: Insufficient documentation

## 2021-09-18 DIAGNOSIS — K909 Intestinal malabsorption, unspecified: Secondary | ICD-10-CM | POA: Insufficient documentation

## 2021-09-18 DIAGNOSIS — E538 Deficiency of other specified B group vitamins: Secondary | ICD-10-CM | POA: Insufficient documentation

## 2021-09-18 LAB — CBC WITH DIFFERENTIAL/PLATELET
Abs Immature Granulocytes: 0.01 10*3/uL (ref 0.00–0.07)
Basophils Absolute: 0 10*3/uL (ref 0.0–0.1)
Basophils Relative: 0 %
Eosinophils Absolute: 0.1 10*3/uL (ref 0.0–0.5)
Eosinophils Relative: 1 %
HCT: 42.9 % (ref 36.0–46.0)
Hemoglobin: 14.8 g/dL (ref 12.0–15.0)
Immature Granulocytes: 0 %
Lymphocytes Relative: 38 %
Lymphs Abs: 2 10*3/uL (ref 0.7–4.0)
MCH: 31.3 pg (ref 26.0–34.0)
MCHC: 34.5 g/dL (ref 30.0–36.0)
MCV: 90.7 fL (ref 80.0–100.0)
Monocytes Absolute: 0.4 10*3/uL (ref 0.1–1.0)
Monocytes Relative: 7 %
Neutro Abs: 2.7 10*3/uL (ref 1.7–7.7)
Neutrophils Relative %: 54 %
Platelets: 287 10*3/uL (ref 150–400)
RBC: 4.73 MIL/uL (ref 3.87–5.11)
RDW: 11.9 % (ref 11.5–15.5)
WBC: 5.2 10*3/uL (ref 4.0–10.5)
nRBC: 0 % (ref 0.0–0.2)

## 2021-09-18 LAB — VITAMIN B12: Vitamin B-12: 238 pg/mL (ref 180–914)

## 2021-09-18 LAB — FOLATE: Folate: 12 ng/mL (ref >5.9)

## 2021-09-18 LAB — IRON AND TIBC
Iron: 163 ug/dL (ref 28–170)
Saturation Ratios: 34 % — ABNORMAL HIGH (ref 10.4–31.8)
TIBC: 479 ug/dL — ABNORMAL HIGH (ref 250–450)
UIBC: 316 ug/dL

## 2021-09-18 LAB — FERRITIN: Ferritin: 28 ng/mL (ref 11–307)

## 2021-09-20 ENCOUNTER — Inpatient Hospital Stay (HOSPITAL_BASED_OUTPATIENT_CLINIC_OR_DEPARTMENT_OTHER): Payer: Federal, State, Local not specified - PPO | Admitting: Medical Oncology

## 2021-09-20 ENCOUNTER — Inpatient Hospital Stay: Payer: Federal, State, Local not specified - PPO

## 2021-09-20 ENCOUNTER — Encounter: Payer: Self-pay | Admitting: Medical Oncology

## 2021-09-20 VITALS — BP 138/98 | HR 74 | Temp 98.7°F | Resp 20 | Wt 142.0 lb

## 2021-09-20 DIAGNOSIS — N926 Irregular menstruation, unspecified: Secondary | ICD-10-CM | POA: Diagnosis not present

## 2021-09-20 DIAGNOSIS — K9589 Other complications of other bariatric procedure: Secondary | ICD-10-CM

## 2021-09-20 DIAGNOSIS — K909 Intestinal malabsorption, unspecified: Secondary | ICD-10-CM | POA: Diagnosis not present

## 2021-09-20 DIAGNOSIS — Z9884 Bariatric surgery status: Secondary | ICD-10-CM | POA: Diagnosis not present

## 2021-09-20 DIAGNOSIS — D508 Other iron deficiency anemias: Secondary | ICD-10-CM

## 2021-09-20 DIAGNOSIS — E538 Deficiency of other specified B group vitamins: Secondary | ICD-10-CM

## 2021-09-20 MED ORDER — CYANOCOBALAMIN 1000 MCG/ML IJ SOLN
1000.0000 ug | Freq: Once | INTRAMUSCULAR | Status: AC
  Administered 2021-09-20: 1000 ug via INTRAMUSCULAR
  Filled 2021-09-20: qty 1

## 2021-09-20 NOTE — Progress Notes (Signed)
Nichole Gentry, Nichole Gentry Phone: [phone]   Clinic Day:  09/20/2021  Referring physician: Lorelee Market, MD  Chief Complaint: Nichole Gentry is a 46 y.o. female s/p gastric bypass surgery presents for iron deficiency and Vitamin B12 deficiency.  PERTINENT HEMATOLOGY HISTORY   # s/p gastric bypass surgery with iron deficiency anemia.  She may have celiac disease.  She is intolerant of oral iron.  EGD in 2019 revealed a hiatal hernia, mildly dilated esophagus and dysmotility esophagus.  There were fundic gland polyps.  Duodenal biopsies revealed some increase in epithelial lymphocytes.  Colonoscopy on 04/22/2019 revealed only hemorrhoids.  She is on a gluten-free diet.  EGD on 11/07/2019 revealed salmon-colored mucosa suspicious for short-segment Barrett's esophagus. There was a small hiatal hernia. Roux-en-Y gastrojejunostomy with gastrojejunal anastomosis characterized by healthy appearing mucosa. Stomach and esophagus biopsies showed chronic inflammation. There was no intestinal metaplasia, dysplasia, and malignancy.  Patient reports feeling extremely tired today.  She missed her appointment for vitamin B12 injection last months.  She had close contact with a friend who was recently diagnosed with strep.  Patient reports some scratchy sore throat and swelling of the left neck.  No fever, chills, she took and COVID testing at home and was negative.  Interval History: Patient is 46 year old female who returns to clinic for follow up and possible IV iron. She reports that she has been feeling well for the most part but had noticed some off and on swelling of her extremities. This has been chronic- on hctz but more noticeable over the past few weeks. Not present today. She also reports some increase in bruising on legs. NO bleeding episodes.     Past Medical History:  Diagnosis Date   ADD (attention deficit disorder)    Anemia    B12 deficiency    GERD  (gastroesophageal reflux disease)    Low iron    Migraine    Simple febrile convulsions ([place])     Past Surgical History:  Procedure Laterality Date   ESOPHAGOGASTRODUODENOSCOPY (EGD) WITH PROPOFOL N/A 11/07/2019   Procedure: ESOPHAGOGASTRODUODENOSCOPY (EGD) WITH PROPOFOL;  Surgeon: Lesly Rubenstein, MD;  Location: [facility] ENDOSCOPY;  Service: Endoscopy;  Laterality: N/A;   HIATAL HERNIA REPAIR     LAPAROSCOPIC REVISION OF ROUXENY WITH UPPER ENDOSCOPY     linx management system      Family History  Problem Relation Age of Onset   Thyroid disease Mother    Diabetes Mother    Hyperlipidemia Father    Lung cancer Father    Brain cancer Father     Social History:  reports that she has never smoked. She has never used smokeless tobacco. She reports current alcohol use. She reports that she does not use drugs. The patient is alone today.  Allergies: No Known Allergies  Current Medications: Current Outpatient Medications  Medication Sig Dispense Refill   amphetamine-dextroamphetamine (ADDERALL) 10 MG tablet Take 10 mg by mouth daily as needed.     Butalbital-APAP-Caffeine 50-325-40 MG capsule Take by mouth.     cyanocobalamin (,VITAMIN B-12,) 1000 MCG/ML injection Inject 1 mL (1,000 mcg total) into the muscle every 30 (thirty) days. Administer weekly x 3 and then monthly. 1 mL 4   folic acid (FOLVITE) 1 MG tablet Take 1 tablet (1 mg total) by mouth daily. 30 tablet 5   LORazepam (ATIVAN) 0.5 MG tablet Take 0.5 mg by mouth 2 (two) times daily as needed.     pantoprazole (PROTONIX) 40 MG  tablet Take by mouth.     SUMAtriptan (IMITREX) 100 MG tablet Take by mouth.     SYRINGE-NEEDLE, DISP, 3 ML (BD SAFETYGLIDE SYRINGE/NEEDLE) 25G X 1" 3 ML MISC Use the needle for b12 injection once a month. 5 each 0   tranexamic acid (LYSTEDA) 650 MG TABS tablet Take 325 mg by mouth 2 (two) times daily.     VYVANSE 70 MG capsule TAKE 1 CAPSULE BY MOUTH IN THE MORNING ONCE DAILY FOR 30 DAYS  0   zolpidem  (AMBIEN) 10 MG tablet Take by mouth.     Drospirenone (SLYND) 4 MG TABS Take 1 tablet by mouth daily. (Patient not taking: Reported on 03/14/2021) 84 tablet 3   hydrochlorothiazide (HYDRODIURIL) 12.5 MG tablet Take 12.5 mg by mouth daily. (Patient not taking: Reported on 09/20/2021)     mirtazapine (REMERON) 15 MG tablet Take 15 mg by mouth at bedtime. (Patient not taking: Reported on 09/20/2021)     Multiple Vitamin (MULTI-VITAMINS) TABS Take by mouth. (Patient not taking: Reported on 09/20/2021)     ondansetron (ZOFRAN ODT) 4 MG disintegrating tablet Take 1 tablet (4 mg total) by mouth every 8 (eight) hours as needed for nausea or vomiting. (Patient not taking: Reported on 03/14/2021) 20 tablet 0   promethazine-dextromethorphan (PROMETHAZINE-DM) 6.25-15 MG/5ML syrup Take 5 mLs by mouth 4 (four) times daily as needed for cough. (Patient not taking: Reported on 09/20/2021) 118 mL 0   No current facility-administered medications for this visit.   Facility-Administered Medications Ordered in Other Visits  Medication Dose Route Frequency Provider Last Rate Last Admin   cyanocobalamin (VITAMIN B12) injection 1,000 mcg  1,000 mcg Intramuscular Once Alinda Dooms, NP        Review of Systems  Constitutional:  Negative for chills, fever, malaise/fatigue and weight loss.  HENT:  Negative for hearing loss, nosebleeds, sore throat and tinnitus.   Eyes:  Negative for blurred vision and double vision.  Respiratory:  Negative for cough, hemoptysis, shortness of breath and wheezing.   Cardiovascular:  Negative for chest pain, palpitations and leg swelling.  Gastrointestinal:  Negative for abdominal pain, blood in stool, constipation, diarrhea, melena, nausea and vomiting.  Genitourinary:  Negative for dysuria and urgency.  Musculoskeletal:  Negative for back pain, falls, joint pain and myalgias.  Skin:  Negative for itching and rash.  Neurological:  Negative for dizziness, tingling, sensory change, loss of  consciousness, weakness and headaches.  Endo/Heme/Allergies:  Negative for environmental allergies. Does not bruise/bleed easily.  Psychiatric/Behavioral:  Negative for depression. The patient is not nervous/anxious and does not have insomnia.     Vitals Blood pressure (!) 138/98, pulse 74, temperature 98.7 F (37.1 C), resp. rate 20, weight 142 lb (64.4 kg), SpO2 100 %.   Performance status (ECOG): 0  Physical Exam Constitutional:      Appearance: She is not ill-appearing.  Pulmonary:     Effort: No respiratory distress.  Abdominal:     General: Bowel sounds are normal. There is no distension.     Tenderness: There is no abdominal tenderness.  Musculoskeletal:        General: No deformity.     Right lower leg: No edema.     Left lower leg: No edema.  Skin:    General: Skin is warm and dry.     Coloration: Skin is not pale.     Comments: Few scattered circular bruises without ulceration of her lower legs   Neurological:     Mental  Status: She is alert and oriented to person, place, and time.  Psychiatric:        Mood and Affect: Mood normal.        Behavior: Behavior normal.       Latest Ref Rng & Units 09/18/2021   11:15 AM 03/13/2021    9:54 AM 08/29/2020    8:56 AM  CBC EXTENDED  WBC 4.0 - 10.5 K/uL 5.2  4.1  4.9   RBC 3.87 - 5.11 MIL/uL 4.73  4.28  4.32   Hemoglobin 12.0 - 15.0 g/dL 24.0  97.3  53.2   HCT 36.0 - 46.0 % 42.9  39.1  38.6   Platelets 150 - 400 K/uL 287  226  231   NEUT# 1.7 - 7.7 K/uL 2.7  1.6  2.2   Lymph# 0.7 - 4.0 K/uL 2.0  2.1  2.1     Iron/TIBC/Ferritin/ %Sat    Component Value Date/Time   IRON 163 09/18/2021 1115   TIBC 479 (H) 09/18/2021 1115   FERRITIN 28 09/18/2021 1115   IRONPCTSAT 34 (H) 09/18/2021 1115   Component Ref Range & Units 1 d ago (03/13/21) 6 mo ago (08/29/20) 9 mo ago (05/30/20) 1 yr ago (02/02/20) 1 yr ago (10/18/19)  Vitamin B-12 180 - 914 pg/mL 267  242 CM  293 CM  169 Low  CM  >7,500 High      Assessment and  Plan  1. Iron deficiency anemia following bariatric surgery   2. B12 deficiency   3. Folate deficiency   4. History of gastric bypass   5. Irregular menses    #Iron deficiency anemia in the context of gastric bypass- hemoglobin is stable at 14.8. No microcytosis however her iron saturation rate is low and her TIBC is high. Ferritin is low at 28. Discussed IV iron to which she is agreeable as she reports that she has more energy when her ferritin level is higher. Platelets are normal. Bruising mild but with her additional symptoms such as edema- not present today- I would recommend PCP follow up for additional discussion/labs.   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 Usually performed at home. She reports that she missed her dose this month which she normally gets on the 10th- asks if she can have this here to which we are agreeable.   #Folate deficiency. Chronic and stable. 12 today. Continue supplementation.    PLAN:  B12 today RTC Venofer x 2 - administered weekly 6 months - lab (cbc, ferritin, iron studies, folate, b12). Day to week later, see MD for results. Ok for virtual appointments if patient prefers.   I discussed the assessment and treatment plan with the patient.  The patient was provided an opportunity to ask questions and all were answered.  The patient agreed with the plan and demonstrated an understanding of the instructions.  The patient was advised to call back if the symptoms worsen or if the condition fails to improve as anticipated.  Clent Jacks PA-C [facility] 09/20/2021

## 2021-09-23 ENCOUNTER — Inpatient Hospital Stay: Payer: Federal, State, Local not specified - PPO

## 2021-09-23 VITALS — BP 103/67 | HR 73 | Temp 97.5°F | Resp 16

## 2021-09-23 DIAGNOSIS — D508 Other iron deficiency anemias: Secondary | ICD-10-CM | POA: Diagnosis not present

## 2021-09-23 DIAGNOSIS — E538 Deficiency of other specified B group vitamins: Secondary | ICD-10-CM | POA: Diagnosis not present

## 2021-09-23 DIAGNOSIS — K909 Intestinal malabsorption, unspecified: Secondary | ICD-10-CM | POA: Diagnosis not present

## 2021-09-23 MED ORDER — SODIUM CHLORIDE 0.9 % IV SOLN
Freq: Once | INTRAVENOUS | Status: AC
  Filled 2021-09-23: qty 250

## 2021-09-23 MED ORDER — SODIUM CHLORIDE 0.9 % IV SOLN
200.0000 mg | Freq: Once | INTRAVENOUS | Status: AC
  Administered 2021-09-23: 200 mg via INTRAVENOUS
  Filled 2021-09-23: qty 200

## 2021-09-27 ENCOUNTER — Ambulatory Visit: Payer: Federal, State, Local not specified - PPO

## 2021-09-27 MED FILL — Iron Sucrose Inj 20 MG/ML (Fe Equiv): INTRAVENOUS | Qty: 10 | Status: AC

## 2021-09-30 ENCOUNTER — Inpatient Hospital Stay: Payer: Federal, State, Local not specified - PPO

## 2021-10-01 MED FILL — Iron Sucrose Inj 20 MG/ML (Fe Equiv): INTRAVENOUS | Qty: 10 | Status: AC

## 2021-10-02 ENCOUNTER — Inpatient Hospital Stay: Payer: Federal, State, Local not specified - PPO

## 2021-10-04 ENCOUNTER — Ambulatory Visit: Payer: Federal, State, Local not specified - PPO

## 2021-10-09 ENCOUNTER — Inpatient Hospital Stay: Payer: Federal, State, Local not specified - PPO | Attending: Nurse Practitioner

## 2021-10-09 VITALS — BP 95/65 | HR 68 | Temp 98.1°F | Resp 16

## 2021-10-09 DIAGNOSIS — Z79899 Other long term (current) drug therapy: Secondary | ICD-10-CM | POA: Insufficient documentation

## 2021-10-09 DIAGNOSIS — D508 Other iron deficiency anemias: Secondary | ICD-10-CM | POA: Insufficient documentation

## 2021-10-09 MED ORDER — SODIUM CHLORIDE 0.9 % IV SOLN
200.0000 mg | Freq: Once | INTRAVENOUS | Status: AC
  Administered 2021-10-09: 200 mg via INTRAVENOUS
  Filled 2021-10-09: qty 200

## 2021-10-09 MED ORDER — SODIUM CHLORIDE 0.9 % IV SOLN
Freq: Once | INTRAVENOUS | Status: AC
  Filled 2021-10-09: qty 250

## 2021-10-18 IMAGING — US US PELVIS COMPLETE WITH TRANSVAGINAL
1 series · 13 of 25 positions shown · non-contrast
Comparison: None.

CLINICAL DATA: Dysfunctional uterine bleeding.  LMP 07/18/2020

EXAM:
TRANSABDOMINAL AND TRANSVAGINAL ULTRASOUND OF PELVIS
DOPPLER ULTRASOUND OF OVARIES
TECHNIQUE: Both transabdominal and transvaginal ultrasound examinations of the
pelvis were performed. Transabdominal technique was performed for
global imaging of the pelvis including uterus, ovaries, adnexal
regions, and pelvic cul-de-sac.
It was necessary to proceed with endovaginal exam following the
transabdominal exam to visualize the endometrium and ovaries
bilaterally. Color and duplex Doppler ultrasound was utilized to
evaluate blood flow to the ovaries.

[Series 1: us pelvis complete with transvaginal · 0.20mm/px · 13 of 120 slices shown]
[im 1/120]
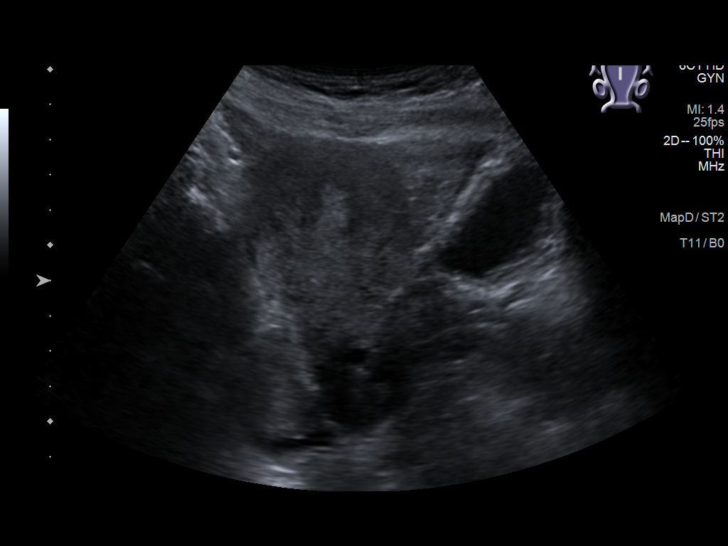
[im 10/120]
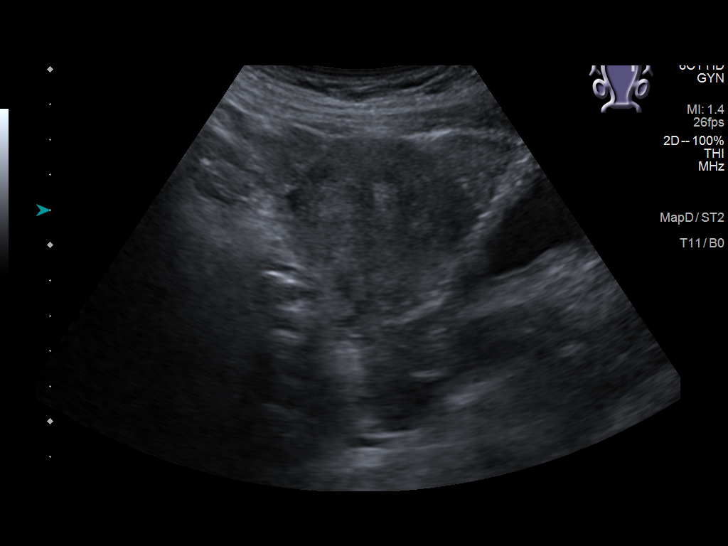
[im 20/120]
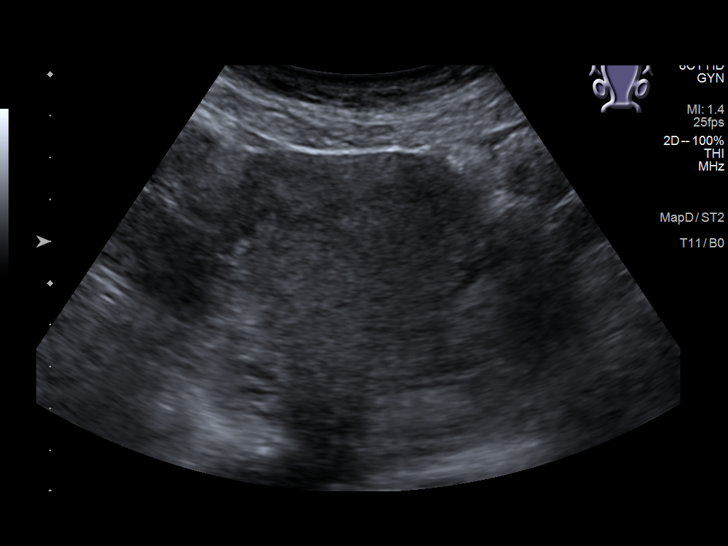
[im 30/120]
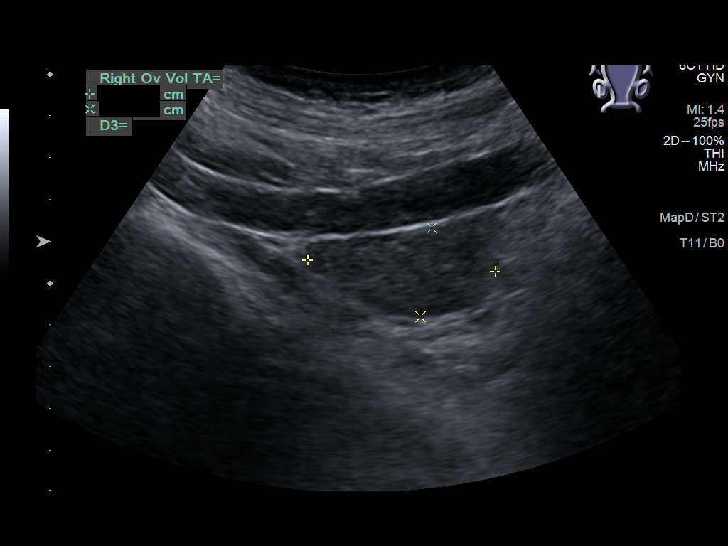
[im 40/120]
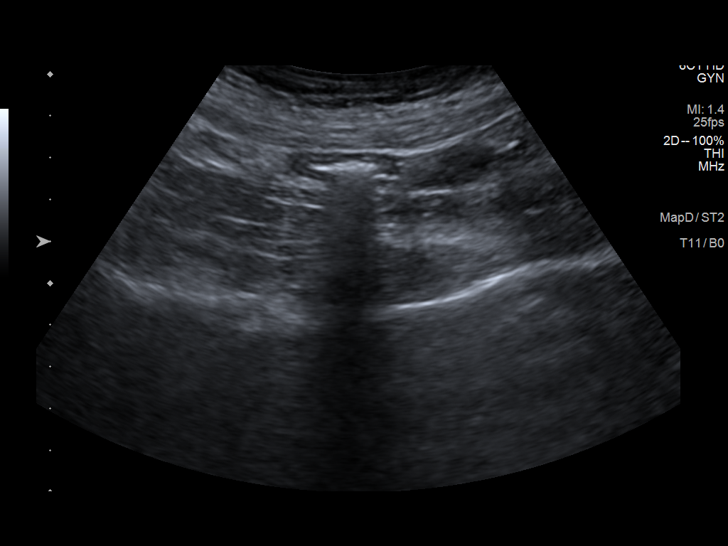
[im 50/120]
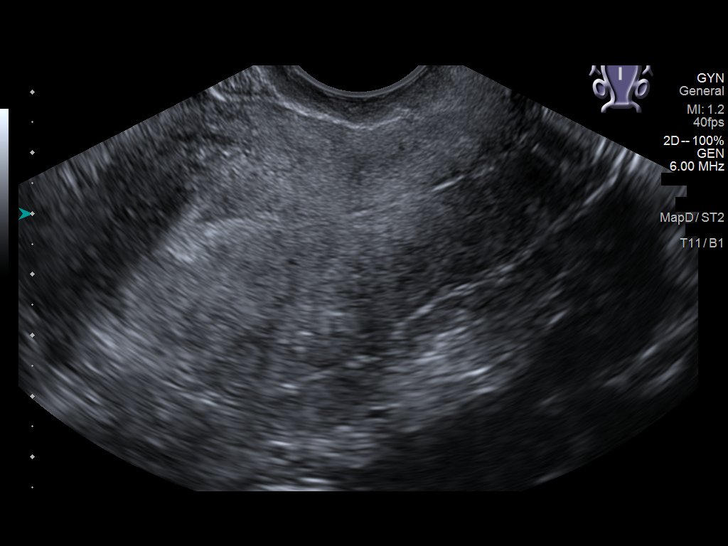
[im 60/120]
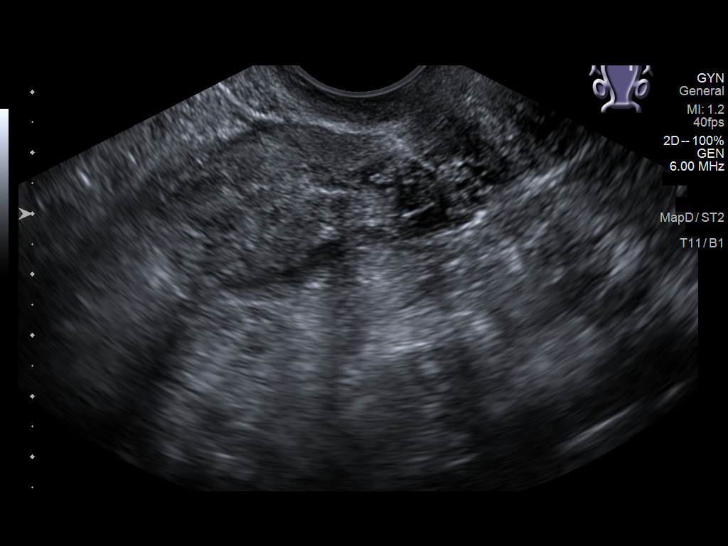
[im 70/120]
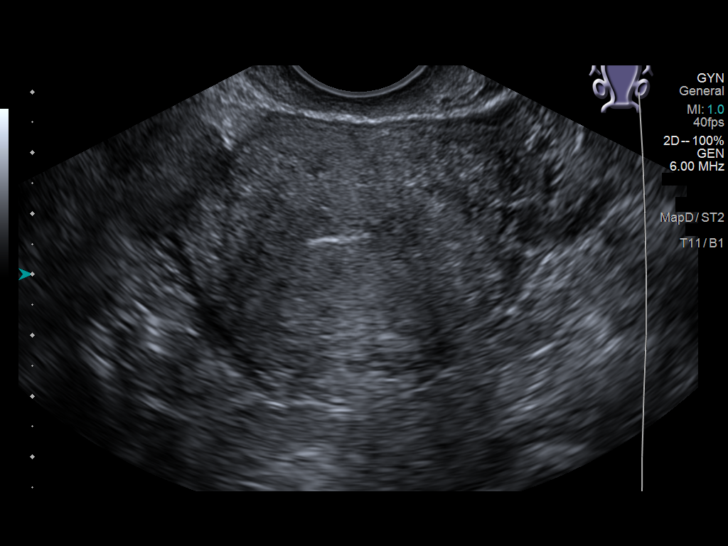
[im 80/120]
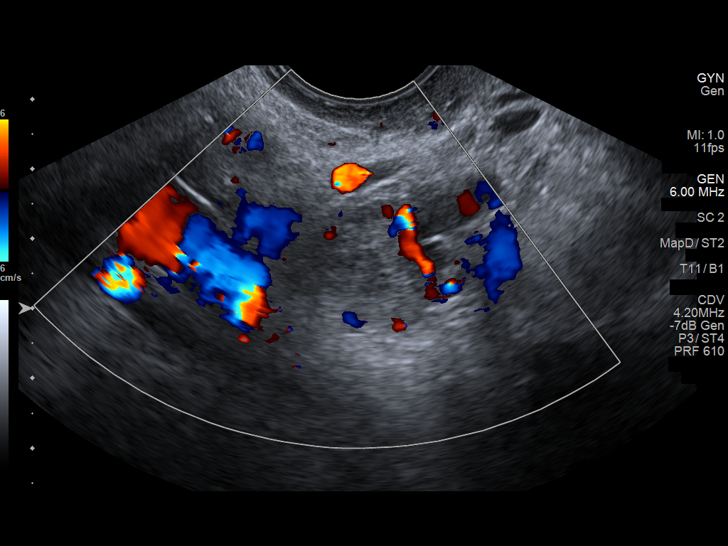
[im 90/120]
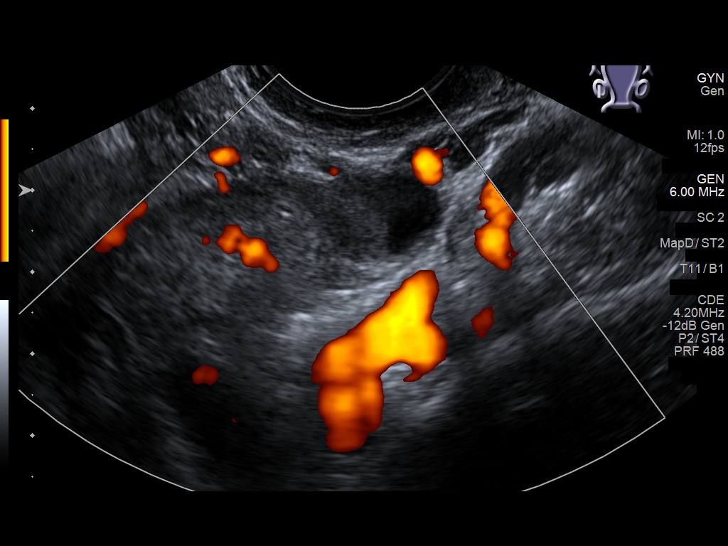
[im 100/120]
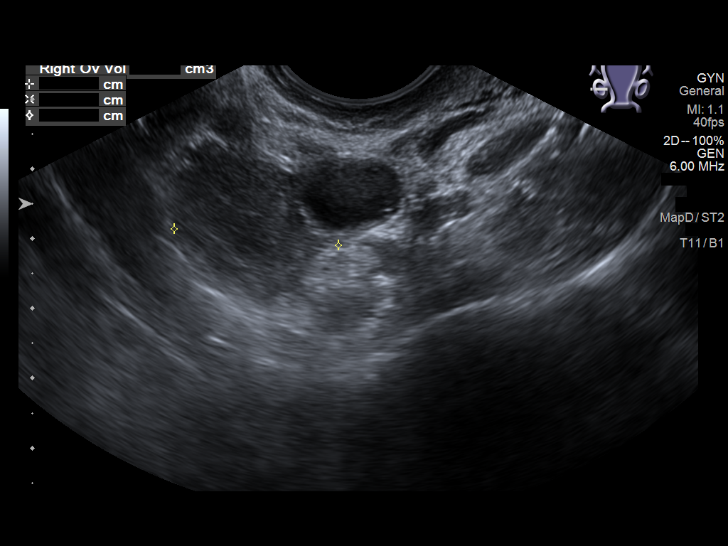
[im 110/120]
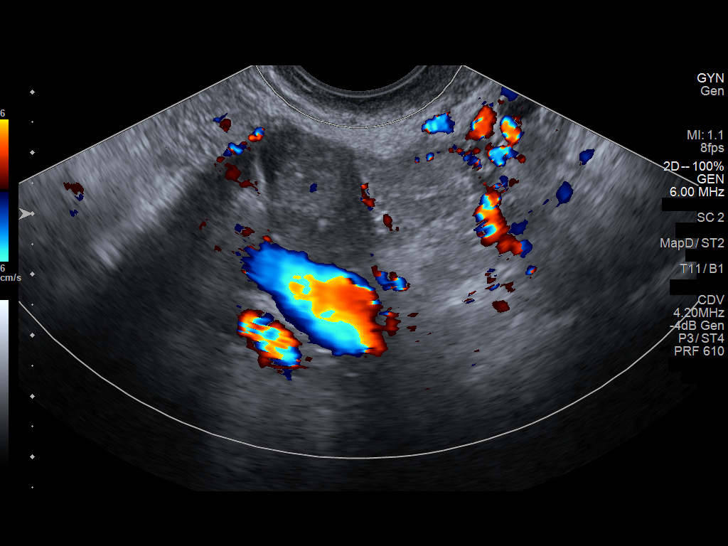
[im 120/120]
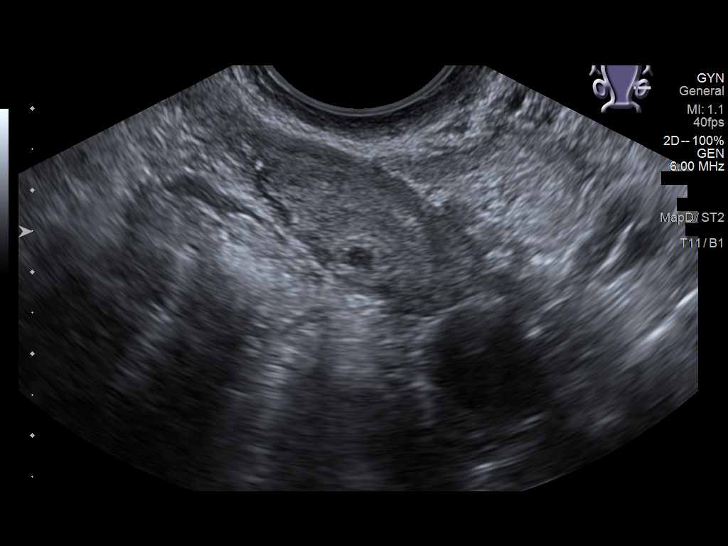

[13 of 25 positions shown; findings below may reference images not displayed]

FINDINGS: Uterus

Measurements: 9.0 x 4.5 x 5.6 cm = volume: 118 mL. No fibroids or
other mass visualized. Nabothian cyst noted within the cervix.

Endometrium

Thickness: 7 mm.  No focal abnormality visualized.

Right ovary

Measurements: 3.4 x 1.9 x 2.4 cm = volume: 8 mL. Normal
appearance/no adnexal mass. Corpus luteum noted within the right
ovary.

Left ovary

Measurements: 2.9 x 1.4 x 1.7 cm = volume: 4 mL. Normal
appearance/no adnexal mass.

Pulsed Doppler evaluation of both ovaries demonstrates normal
low-resistance arterial and venous waveforms.

Other findings

No abnormal free fluid.
IMPRESSION: Normal pelvic sonogram.

## 2022-01-31 DIAGNOSIS — F411 Generalized anxiety disorder: Secondary | ICD-10-CM | POA: Diagnosis not present

## 2022-01-31 DIAGNOSIS — F909 Attention-deficit hyperactivity disorder, unspecified type: Secondary | ICD-10-CM | POA: Diagnosis not present

## 2022-03-21 ENCOUNTER — Other Ambulatory Visit: Payer: Self-pay

## 2022-03-21 DIAGNOSIS — D508 Other iron deficiency anemias: Secondary | ICD-10-CM

## 2022-03-24 ENCOUNTER — Inpatient Hospital Stay (HOSPITAL_BASED_OUTPATIENT_CLINIC_OR_DEPARTMENT_OTHER): Payer: Federal, State, Local not specified - PPO | Admitting: Oncology

## 2022-03-24 ENCOUNTER — Encounter: Payer: Self-pay | Admitting: Oncology

## 2022-03-24 ENCOUNTER — Inpatient Hospital Stay: Payer: Federal, State, Local not specified - PPO | Attending: Oncology

## 2022-03-24 ENCOUNTER — Telehealth: Payer: Self-pay | Admitting: Internal Medicine

## 2022-03-24 VITALS — BP 118/87 | HR 75 | Temp 97.5°F | Wt 149.1 lb

## 2022-03-24 DIAGNOSIS — K9589 Other complications of other bariatric procedure: Secondary | ICD-10-CM | POA: Diagnosis not present

## 2022-03-24 DIAGNOSIS — D508 Other iron deficiency anemias: Secondary | ICD-10-CM | POA: Insufficient documentation

## 2022-03-24 DIAGNOSIS — E538 Deficiency of other specified B group vitamins: Secondary | ICD-10-CM

## 2022-03-24 DIAGNOSIS — G609 Hereditary and idiopathic neuropathy, unspecified: Secondary | ICD-10-CM | POA: Insufficient documentation

## 2022-03-24 DIAGNOSIS — G629 Polyneuropathy, unspecified: Secondary | ICD-10-CM | POA: Diagnosis not present

## 2022-03-24 DIAGNOSIS — M542 Cervicalgia: Secondary | ICD-10-CM | POA: Insufficient documentation

## 2022-03-24 DIAGNOSIS — Z79899 Other long term (current) drug therapy: Secondary | ICD-10-CM | POA: Diagnosis not present

## 2022-03-24 DIAGNOSIS — Z9884 Bariatric surgery status: Secondary | ICD-10-CM | POA: Diagnosis not present

## 2022-03-24 LAB — CBC WITH DIFFERENTIAL (CANCER CENTER ONLY)
Abs Immature Granulocytes: 0.01 10*3/uL (ref 0.00–0.07)
Basophils Absolute: 0 10*3/uL (ref 0.0–0.1)
Basophils Relative: 1 %
Eosinophils Absolute: 0.1 10*3/uL (ref 0.0–0.5)
Eosinophils Relative: 1 %
HCT: 41.2 % (ref 36.0–46.0)
Hemoglobin: 13.7 g/dL (ref 12.0–15.0)
Immature Granulocytes: 0 %
Lymphocytes Relative: 40 %
Lymphs Abs: 1.8 10*3/uL (ref 0.7–4.0)
MCH: 30.4 pg (ref 26.0–34.0)
MCHC: 33.3 g/dL (ref 30.0–36.0)
MCV: 91.6 fL (ref 80.0–100.0)
Monocytes Absolute: 0.3 10*3/uL (ref 0.1–1.0)
Monocytes Relative: 7 %
Neutro Abs: 2.4 10*3/uL (ref 1.7–7.7)
Neutrophils Relative %: 51 %
Platelet Count: 238 10*3/uL (ref 150–400)
RBC: 4.5 MIL/uL (ref 3.87–5.11)
RDW: 12 % (ref 11.5–15.5)
WBC Count: 4.6 10*3/uL (ref 4.0–10.5)
nRBC: 0 % (ref 0.0–0.2)

## 2022-03-24 LAB — FERRITIN: Ferritin: 33 ng/mL (ref 11–307)

## 2022-03-24 LAB — FOLATE: Folate: 6.7 ng/mL (ref >5.9)

## 2022-03-24 LAB — IRON AND TIBC
Iron: 150 ug/dL (ref 28–170)
Saturation Ratios: 38 % — ABNORMAL HIGH (ref 10.4–31.8)
TIBC: 399 ug/dL (ref 250–450)
UIBC: 249 ug/dL

## 2022-03-24 LAB — VITAMIN B12: Vitamin B-12: 297 pg/mL (ref 180–914)

## 2022-03-24 NOTE — Progress Notes (Signed)
Hematology/Oncology Progress note [phone] [phone]     Chief Complaint: Nichole Gentry is a 47 y.o. female s/p gastric bypass surgery presents for iron deficiency and Vitamin B12 deficiency.  ASSESSMENT & PLAN:   Iron deficiency anemia following bariatric surgery #Iron deficiency anemia in the context of gastric bypass Lab results are available after her visit.  Hemoglobin remained stable today at 13.7.  Ferritin 33, iron saturation 38.    B12 deficiency B12 level is at low normal end.  Recommend patient to continue monthly parenteral vitamin B12 injection 1000 MCG  Folate deficiency Folate level is normal.    Orders Placed This Encounter  Procedures   CMP ([place] only)    Standing Status:   Future    Standing Expiration Date:   03/25/2023   CBC with Differential ([facility] Only)    Standing Status:   Future    Standing Expiration Date:   03/25/2023   Vitamin B12    Standing Status:   Future    Standing Expiration Date:   03/25/2023   Folate    Standing Status:   Future    Standing Expiration Date:   03/25/2023   Iron and TIBC    Standing Status:   Future    Standing Expiration Date:   03/25/2023   Ferritin    Standing Status:   Future    Standing Expiration Date:   03/25/2023   Amb Referral to Neuro Oncology    Referral Priority:   Routine    Referral Type:   Consultation    Referral Reason:   Specialty Services Required    Number of Visits Requested:   1   Follow up in 6 months.  All questions were answered. The patient knows to call the clinic with any problems, questions or concerns.  Earlie Server, MD, PhD [facility] Hematology Oncology 03/24/2022   PERTINENT HEMATOLOGY HISTORY   # s/p gastric bypass surgery with iron deficiency anemia.  She may have celiac disease.  She is intolerant of oral iron.  EGD in 2019 revealed a hiatal hernia, mildly dilated esophagus and dysmotility esophagus.  There were fundic gland polyps.   Duodenal biopsies revealed some increase in epithelial lymphocytes.  Colonoscopy on 04/22/2019 revealed only hemorrhoids.  She is on a gluten-free diet.  EGD on 11/07/2019 revealed salmon-colored mucosa suspicious for short-segment Barrett's esophagus. There was a small hiatal hernia. Roux-en-Y gastrojejunostomy with gastrojejunal anastomosis characterized by healthy appearing mucosa. Stomach and esophagus biopsies showed chronic inflammation. There was no intestinal metaplasia, dysplasia, and malignancy.  Patient reports feeling extremely tired today.  She missed her appointment for vitamin B12 injection last months.  She had close contact with a friend who was recently diagnosed with strep.  Patient reports some scratchy sore throat and swelling of the left neck.  No fever, chills, she took and COVID testing at home and was negative.   INTERVAL HISTORY Nichole Gentry is a 47 y.o. female who has above history reviewed by me today presents for follow up visit for Iron deficiency anemia and B12 deficiency.  Patient was seen by nurse practitioner 12 months ago and 6 months ago. Today she reports feeling well.  She used to get vitamin B12 injections.  Last injection at our facility was on 09/20/2021.  Recently she has asked her colleague to give her a B12 injection. No new complaints.  Past Medical History:  Diagnosis Date   ADD (attention deficit disorder)    Anemia  B12 deficiency    GERD (gastroesophageal reflux disease)    Low iron    Migraine    Simple febrile convulsions ([place])     Past Surgical History:  Procedure Laterality Date   ESOPHAGOGASTRODUODENOSCOPY (EGD) WITH PROPOFOL N/A 11/07/2019   Procedure: ESOPHAGOGASTRODUODENOSCOPY (EGD) WITH PROPOFOL;  Surgeon: Lesly Rubenstein, MD;  Location: [facility] ENDOSCOPY;  Service: Endoscopy;  Laterality: N/A;   HIATAL HERNIA REPAIR     LAPAROSCOPIC REVISION OF ROUXENY WITH UPPER ENDOSCOPY     linx management system      Family History   Problem Relation Age of Onset   Thyroid disease Mother    Diabetes Mother    Hyperlipidemia Father    Lung cancer Father    Brain cancer Father     Social History:  reports that she has never smoked. She has never used smokeless tobacco. She reports current alcohol use. She reports that she does not use drugs. The patient is alone today.  Allergies: No Known Allergies  Current Medications: Current Outpatient Medications  Medication Sig Dispense Refill   amphetamine-dextroamphetamine (ADDERALL) 10 MG tablet Take 10 mg by mouth daily as needed.     Butalbital-APAP-Caffeine 50-325-40 MG capsule Take by mouth.     cyanocobalamin (,VITAMIN B-12,) 1000 MCG/ML injection Inject 1 mL (1,000 mcg total) into the muscle every 30 (thirty) days. Administer weekly x 3 and then monthly. 1 mL 4   folic acid (FOLVITE) 1 MG tablet Take 1 tablet (1 mg total) by mouth daily. 30 tablet 5   LORazepam (ATIVAN) 0.5 MG tablet Take 0.5 mg by mouth 2 (two) times daily as needed.     pantoprazole (PROTONIX) 40 MG tablet Take by mouth.     SUMAtriptan (IMITREX) 100 MG tablet Take by mouth.     SYRINGE-NEEDLE, DISP, 3 ML (BD SAFETYGLIDE SYRINGE/NEEDLE) 25G X 1" 3 ML MISC Use the needle for b12 injection once a month. 5 each 0   tranexamic acid (LYSTEDA) 650 MG TABS tablet Take 325 mg by mouth 2 (two) times daily.     VYVANSE 70 MG capsule TAKE 1 CAPSULE BY MOUTH IN THE MORNING ONCE DAILY FOR 30 DAYS  0   zolpidem (AMBIEN) 10 MG tablet Take by mouth.     Drospirenone (SLYND) 4 MG TABS Take 1 tablet by mouth daily. (Patient not taking: Reported on 03/14/2021) 84 tablet 3   hydrochlorothiazide (HYDRODIURIL) 12.5 MG tablet Take 12.5 mg by mouth daily. (Patient not taking: Reported on 09/20/2021)     mirtazapine (REMERON) 15 MG tablet Take 15 mg by mouth at bedtime. (Patient not taking: Reported on 09/20/2021)     Multiple Vitamin (MULTI-VITAMINS) TABS Take by mouth. (Patient not taking: Reported on 09/20/2021)      ondansetron (ZOFRAN ODT) 4 MG disintegrating tablet Take 1 tablet (4 mg total) by mouth every 8 (eight) hours as needed for nausea or vomiting. (Patient not taking: Reported on 03/14/2021) 20 tablet 0   promethazine-dextromethorphan (PROMETHAZINE-DM) 6.25-15 MG/5ML syrup Take 5 mLs by mouth 4 (four) times daily as needed for cough. (Patient not taking: Reported on 09/20/2021) 118 mL 0   No current facility-administered medications for this visit.     Review of Systems  Constitutional:  Positive for fatigue. Negative for appetite change, chills and fever.  HENT:   Negative for hearing loss and voice change.   Eyes:  Negative for eye problems.  Respiratory:  Negative for chest tightness and cough.   Cardiovascular:  Negative for chest pain.  Gastrointestinal:  Negative for abdominal distention, abdominal pain and blood in stool.  Endocrine: Negative for hot flashes.  Genitourinary:  Negative for difficulty urinating and frequency.   Musculoskeletal:  Negative for arthralgias.  Skin:  Negative for itching and rash.  Neurological:  Negative for extremity weakness.  Hematological:  Negative for adenopathy.  Psychiatric/Behavioral:  Negative for confusion.       Vitals Blood pressure 118/87, pulse 75, temperature (!) 97.5 F (36.4 C), temperature source Tympanic, weight 149 lb 1.6 oz (67.6 kg), SpO2 100 %.  Performance status (ECOG): 0  Physical Exam Constitutional:      General: She is not in acute distress.    Appearance: She is not diaphoretic.  HENT:     Head: Normocephalic and atraumatic.     Nose: Nose normal.     Mouth/Throat:     Pharynx: No oropharyngeal exudate.  Eyes:     General: No scleral icterus.    Pupils: Pupils are equal, round, and reactive to light.  Cardiovascular:     Rate and Rhythm: Normal rate and regular rhythm.  Pulmonary:     Effort: Pulmonary effort is normal. No respiratory distress.  Abdominal:     General: Bowel sounds are normal. There is no  distension.     Palpations: Abdomen is soft.  Musculoskeletal:        General: Normal range of motion.     Cervical back: Normal range of motion and neck supple.  Skin:    General: Skin is warm.     Findings: No erythema.  Neurological:     Mental Status: She is alert and oriented to person, place, and time. Mental status is at baseline.     Cranial Nerves: No cranial nerve deficit.     Motor: No abnormal muscle tone.  Psychiatric:        Mood and Affect: Mood and affect normal.      Labs are reviewed     Latest Ref Rng & Units 03/24/2022   10:39 AM 09/18/2021   11:15 AM 03/13/2021    9:54 AM  CBC  WBC 4.0 - 10.5 K/uL 4.6  5.2  4.1   Hemoglobin 12.0 - 15.0 g/dL 13.7  14.8  13.2   Hematocrit 36.0 - 46.0 % 41.2  42.9  39.1   Platelets 150 - 400 K/uL 238  287  226       Latest Ref Rng & Units 08/29/2020    8:56 AM 06/30/2017   12:27 PM  CMP  Glucose 70 - 99 mg/dL 100  117   BUN 6 - 20 mg/dL 19  15   Creatinine 0.44 - 1.00 mg/dL 0.88  0.88   Sodium 135 - 145 mmol/L 136  138   Potassium 3.5 - 5.1 mmol/L 3.7  3.2   Chloride 98 - 111 mmol/L 103  105   CO2 22 - 32 mmol/L 27  23   Calcium 8.9 - 10.3 mg/dL 8.4  9.0   Total Protein 6.5 - 8.1 g/dL 6.9  7.6   Total Bilirubin 0.3 - 1.2 mg/dL 0.7  0.9   Alkaline Phos 38 - 126 U/L 62  65   AST 15 - 41 U/L 18  19   ALT 0 - 44 U/L 15  11

## 2022-03-24 NOTE — Telephone Encounter (Signed)
Scheduled appt per 2/19 referral. Pt is aware of appt date and time. Pt is aware to arrive 15 mins prior to appt time and to bring and updated insurance card. Pt is aware of appt location.

## 2022-03-24 NOTE — Assessment & Plan Note (Signed)
#  Iron deficiency anemia in the context of gastric bypass Lab results are available after her visit.  Hemoglobin remained stable today at 13.7.  Ferritin 33, iron saturation 38.

## 2022-03-24 NOTE — Assessment & Plan Note (Signed)
B12 level is at low normal end.  Recommend patient to continue monthly parenteral vitamin B12 injection 1000 MCG

## 2022-03-24 NOTE — Assessment & Plan Note (Signed)
Folate level is normal.

## 2022-03-25 ENCOUNTER — Telehealth: Payer: Self-pay

## 2022-03-25 NOTE — Telephone Encounter (Signed)
-----   Message from Earlie Server, MD sent at 03/24/2022  8:45 PM EST ----- Labs are available after her visit.  Recommend monthly B12 inj x 6 And also IV venofer x 1 please arrange Keep same follow up appt.

## 2022-03-25 NOTE — Telephone Encounter (Signed)
Called pt, no answer. Informed her that we would be setting appts up and if they didn't work to call back and r/s.   Please schedule and inform pt / mail appts: B12 inj monthly x6 Vneofer x1  Keep follow up as scheduled.

## 2022-03-26 ENCOUNTER — Encounter: Payer: Self-pay | Admitting: Oncology

## 2022-03-26 DIAGNOSIS — K080 Exfoliation of teeth due to systemic causes: Secondary | ICD-10-CM | POA: Diagnosis not present

## 2022-04-03 ENCOUNTER — Other Ambulatory Visit: Payer: Self-pay

## 2022-04-03 ENCOUNTER — Telehealth: Payer: Self-pay | Admitting: Family Medicine

## 2022-04-03 ENCOUNTER — Inpatient Hospital Stay: Payer: Federal, State, Local not specified - PPO

## 2022-04-03 ENCOUNTER — Other Ambulatory Visit: Payer: Self-pay | Admitting: *Deleted

## 2022-04-03 ENCOUNTER — Inpatient Hospital Stay: Payer: Federal, State, Local not specified - PPO | Admitting: Internal Medicine

## 2022-04-03 VITALS — BP 123/76 | HR 81 | Temp 97.5°F | Resp 20 | Wt 146.9 lb

## 2022-04-03 DIAGNOSIS — R200 Anesthesia of skin: Secondary | ICD-10-CM | POA: Diagnosis not present

## 2022-04-03 DIAGNOSIS — Z9884 Bariatric surgery status: Secondary | ICD-10-CM | POA: Diagnosis not present

## 2022-04-03 DIAGNOSIS — G609 Hereditary and idiopathic neuropathy, unspecified: Secondary | ICD-10-CM | POA: Diagnosis not present

## 2022-04-03 DIAGNOSIS — D508 Other iron deficiency anemias: Secondary | ICD-10-CM | POA: Diagnosis not present

## 2022-04-03 DIAGNOSIS — G6289 Other specified polyneuropathies: Secondary | ICD-10-CM

## 2022-04-03 DIAGNOSIS — Z79899 Other long term (current) drug therapy: Secondary | ICD-10-CM | POA: Diagnosis not present

## 2022-04-03 DIAGNOSIS — E538 Deficiency of other specified B group vitamins: Secondary | ICD-10-CM | POA: Diagnosis not present

## 2022-04-03 DIAGNOSIS — M542 Cervicalgia: Secondary | ICD-10-CM

## 2022-04-03 DIAGNOSIS — G629 Polyneuropathy, unspecified: Secondary | ICD-10-CM | POA: Insufficient documentation

## 2022-04-03 DIAGNOSIS — G8929 Other chronic pain: Secondary | ICD-10-CM

## 2022-04-03 LAB — VITAMIN D 25 HYDROXY (VIT D DEFICIENCY, FRACTURES): Vit D, 25-Hydroxy: 19.96 ng/mL — ABNORMAL LOW (ref 30–100)

## 2022-04-03 LAB — HIV ANTIBODY (ROUTINE TESTING W REFLEX): HIV Screen 4th Generation wRfx: NONREACTIVE

## 2022-04-03 LAB — SEDIMENTATION RATE: Sed Rate: 5 mm/hr (ref 0–22)

## 2022-04-03 LAB — VITAMIN B12: Vitamin B-12: 289 pg/mL (ref 180–914)

## 2022-04-03 LAB — TSH: TSH: 0.602 u[IU]/mL (ref 0.350–4.500)

## 2022-04-03 NOTE — Progress Notes (Signed)
[facility] at [place], [place] [address] [phone]   New Patient Evaluation  Date of Service: 04/03/22 Patient Name: Nichole Gentry Patient MRN: [id number] Patient DOB: [birth year] Provider: Ventura Sellers, MD  Identifying Statement:  Nichole Gentry is a 47 y.o. female with neuropathy who presents for initial consultation and evaluation regarding neurologic deficits.    Referring Provider: Earlie Server, MD [place],  [place] [address]  History of Present Illness: The patient's records from the referring physician were obtained and reviewed and the patient interviewed to confirm this HPI.  Nichole Gentry presents to review recent neuropathic complaints.  She describes ~1 year history of progressive numbness, tingling, pain in bottoms of feet and hands.  Symptoms are symmetric.  There is no marked weakness.  Also describes longer history of neck pain, more chronic.  There is sometimes shooting pain down an arm, this may lead her to drop things from her hand.  Previously had mri of the cervical spine but this was not followed up on, per patient.  History of roux-en-y bypass, doses vitamin B12 IM monthly.  Medications: Current Outpatient Medications on File Prior to Visit  Medication Sig Dispense Refill   amphetamine-dextroamphetamine (ADDERALL) 10 MG tablet Take 10 mg by mouth daily as needed.     Butalbital-APAP-Caffeine 50-325-40 MG capsule Take by mouth.     cyanocobalamin (,VITAMIN B-12,) 1000 MCG/ML injection Inject 1 mL (1,000 mcg total) into the muscle every 30 (thirty) days. Administer weekly x 3 and then monthly. 1 mL 4   LORazepam (ATIVAN) 0.5 MG tablet Take 0.5 mg by mouth 2 (two) times daily as needed.     pantoprazole (PROTONIX) 40 MG tablet Take by mouth.     tranexamic acid (LYSTEDA) 650 MG TABS tablet Take 325 mg by mouth 2 (two) times daily.     VYVANSE 70 MG capsule TAKE 1 CAPSULE BY MOUTH IN THE MORNING ONCE  DAILY FOR 30 DAYS  0   hydrochlorothiazide (HYDRODIURIL) 12.5 MG tablet Take 12.5 mg by mouth daily. (Patient not taking: Reported on 09/20/2021)     Multiple Vitamin (MULTI-VITAMINS) TABS Take by mouth. (Patient not taking: Reported on 09/20/2021)     ondansetron (ZOFRAN ODT) 4 MG disintegrating tablet Take 1 tablet (4 mg total) by mouth every 8 (eight) hours as needed for nausea or vomiting. (Patient not taking: Reported on 03/14/2021) 20 tablet 0   SUMAtriptan (IMITREX) 100 MG tablet Take by mouth. (Patient not taking: Reported on 04/03/2022)     SYRINGE-NEEDLE, DISP, 3 ML (BD SAFETYGLIDE SYRINGE/NEEDLE) 25G X 1" 3 ML MISC Use the needle for b12 injection once a month. (Patient not taking: Reported on 04/03/2022) 5 each 0   zolpidem (AMBIEN) 10 MG tablet Take by mouth. (Patient not taking: Reported on 04/03/2022)     No current facility-administered medications on file prior to visit.    Allergies: No Known Allergies Past Medical History:  Past Medical History:  Diagnosis Date   ADD (attention deficit disorder)    Anemia    B12 deficiency    GERD (gastroesophageal reflux disease)    Low iron    Migraine    Simple febrile convulsions ([facility])    Past Surgical History:  Past Surgical History:  Procedure Laterality Date   ESOPHAGOGASTRODUODENOSCOPY (EGD) WITH PROPOFOL N/A 11/07/2019   Procedure: ESOPHAGOGASTRODUODENOSCOPY (EGD) WITH PROPOFOL;  Surgeon: Nichole Rubenstein, MD;  Location: [facility] ENDOSCOPY;  Service: Endoscopy;  Laterality: N/A;  HIATAL HERNIA REPAIR     LAPAROSCOPIC REVISION OF ROUXENY WITH UPPER ENDOSCOPY     linx management system     Social History:  Social History   Socioeconomic History   Marital status: Single    Spouse name: Not on file   Number of children: Not on file   Years of education: Not on file   Highest education level: Not on file  Occupational History   Not on file  Tobacco Use   Smoking status: Never   Smokeless tobacco: Never  Vaping Use    Vaping Use: Never used  Substance and Sexual Activity   Alcohol use: Yes    Comment: occasionally   Drug use: Never   Sexual activity: Yes    Birth control/protection: None  Other Topics Concern   Not on file  Social History Narrative   Not on file   Social Determinants of Health   Financial Resource Strain: Not on file  Food Insecurity: Food Insecurity Present (04/03/2022)   Hunger Vital Sign    Worried About Running Out of Food in the Last Year: Sometimes true    Ran Out of Food in the Last Year: Sometimes true  Transportation Needs: No Transportation Needs (04/03/2022)   PRAPARE - Hydrologist (Medical): No    Lack of Transportation (Non-Medical): No  Physical Activity: Not on file  Stress: Not on file  Social Connections: Not on file  Intimate Partner Violence: Not At Risk (04/03/2022)   Humiliation, Afraid, Rape, and Kick questionnaire    Fear of Current or Ex-Partner: No    Emotionally Abused: No    Physically Abused: No    Sexually Abused: No   Family History:  Family History  Problem Relation Age of Onset   Thyroid disease Mother    Diabetes Mother    Hyperlipidemia Father    Lung cancer Father    Brain cancer Father     Review of Systems: Constitutional: Doesn't report fevers, chills or abnormal weight loss Eyes: Doesn't report blurriness of vision Ears, nose, mouth, throat, and face: Doesn't report sore throat Respiratory: Doesn't report cough, dyspnea or wheezes Cardiovascular: Doesn't report palpitation, chest discomfort  Gastrointestinal:  Doesn't report nausea, constipation, diarrhea GU: Doesn't report incontinence Skin: Doesn't report skin rashes Neurological: Per HPI Musculoskeletal: Doesn't report joint pain Behavioral/Psych: Doesn't report anxiety  Physical Exam: Vitals:   04/03/22 0948  BP: 123/76  Pulse: 81  Resp: 20  Temp: (!) 97.5 F (36.4 C)  SpO2: 100%   KPS: 80. General: Alert, cooperative, pleasant,  in no acute distress Head: Normal EENT: No conjunctival injection or scleral icterus.  Lungs: Resp effort normal Cardiac: Regular rate Abdomen: Non-distended abdomen Skin: No rashes cyanosis or petechiae. Extremities: No clubbing or edema  Neurologic Exam: Mental Status: Awake, alert, attentive to examiner. Oriented to self and environment. Language is fluent with intact comprehension.  Cranial Nerves: Visual acuity is grossly normal. Visual fields are full. Extra-ocular movements intact. No ptosis. Face is symmetric Motor: Tone and bulk are normal. Power is full in both arms and legs. Reflexes are symmetric, no pathologic reflexes present.  Sensory: Intact to light touch Gait: Normal.   Labs: I have reviewed the data as listed    Component Value Date/Time   NA 136 08/29/2020 0856   K 3.7 08/29/2020 0856   CL 103 08/29/2020 0856   CO2 27 08/29/2020 0856   GLUCOSE 100 (H) 08/29/2020 0856   BUN 19 08/29/2020  0856   CREATININE 0.88 08/29/2020 0856   CALCIUM 8.4 (L) 08/29/2020 0856   PROT 6.9 08/29/2020 0856   ALBUMIN 4.1 08/29/2020 0856   AST 18 08/29/2020 0856   ALT 15 08/29/2020 0856   ALKPHOS 62 08/29/2020 0856   BILITOT 0.7 08/29/2020 0856   GFRNONAA >60 08/29/2020 0856   GFRAA >60 06/30/2017 1227   Lab Results  Component Value Date   WBC 4.6 03/24/2022   NEUTROABS 2.4 03/24/2022   HGB 13.7 03/24/2022   HCT 41.2 03/24/2022   MCV 91.6 03/24/2022   PLT 238 03/24/2022    Assessment/Plan Bilateral hand numbness - Plan: MR CERVICAL SPINE W WO CONTRAST  Neck pain, chronic - Plan: MR CERVICAL SPINE W WO CONTRAST  Hereditary and idiopathic peripheral neuropathy - Plan: CANCELED: Hemoglobin A1c, CANCELED: VITAMIN D 25 Hydroxy (Vit-D Deficiency, Fractures), CANCELED: Vitamin B12, CANCELED: Sedimentation rate, CANCELED: Protein electrophoresis, serum, CANCELED: TSH, CANCELED: Zinc, CANCELED: Copper, serum, CANCELED: Vitamin B1, CANCELED: HIV antibody (with reflex)  Other  polyneuropathy  Mekhiya Prestage presents with clinical syndrome consistent with symmetric, length dependent, small and large fiber peripheral neuropathy.  Etiology is unknown.  Risk factors may include history of gastric bypass surgery, vitamin/mineral deficiency.  She has also not been screened for diabetes in recent years.   To identify etiology of neuropathy, we will check serum for HgbA1C, B12, Vit D, Thiamine, copper, zinc, ESR, SPEP, HIV.  Will defer EMG for now.  Also recommended repeating MRI c-spine given prominent osteophyte at C5-6 (per report from 2022), ongoing neck discomfort refractory to oral analgesia.  We reviewed pathophysiology of peripheral neuropathy, available treatments, and goals of care.  We spent twenty additional minutes teaching regarding the natural history, biology, and historical experience in the treatment of neurologic complications of cancer.   We appreciate the opportunity to participate in the care of [facility].   We ask that Secilia Murrill return to clinic in 1 months following spine MRI and serologic workup, or sooner as needed.  All questions were answered. The patient knows to call the clinic with any problems, questions or concerns. No barriers to learning were detected.  The total time spent in the encounter was 60 minutes and more than 50% was on counseling and review of test results   Ventura Sellers, MD Medical Director of Neuro-Oncology [facility] [place] 04/03/22 10:48 AM

## 2022-04-03 NOTE — Telephone Encounter (Signed)
Per 2/29 LOS reached out to patient to schedule follow up, patient aware of date and time.

## 2022-04-04 LAB — HEMOGLOBIN A1C
Hgb A1c MFr Bld: 5.6 % (ref 4.8–5.6)
Mean Plasma Glucose: 114 mg/dL

## 2022-04-07 ENCOUNTER — Inpatient Hospital Stay: Payer: Federal, State, Local not specified - PPO | Attending: Internal Medicine

## 2022-04-07 VITALS — BP 114/80 | HR 96 | Temp 98.9°F | Resp 16

## 2022-04-07 DIAGNOSIS — G629 Polyneuropathy, unspecified: Secondary | ICD-10-CM | POA: Diagnosis not present

## 2022-04-07 DIAGNOSIS — Z9884 Bariatric surgery status: Secondary | ICD-10-CM | POA: Diagnosis not present

## 2022-04-07 DIAGNOSIS — K9589 Other complications of other bariatric procedure: Secondary | ICD-10-CM

## 2022-04-07 DIAGNOSIS — D508 Other iron deficiency anemias: Secondary | ICD-10-CM | POA: Diagnosis not present

## 2022-04-07 DIAGNOSIS — Z79899 Other long term (current) drug therapy: Secondary | ICD-10-CM | POA: Diagnosis not present

## 2022-04-07 DIAGNOSIS — R519 Headache, unspecified: Secondary | ICD-10-CM | POA: Insufficient documentation

## 2022-04-07 DIAGNOSIS — E538 Deficiency of other specified B group vitamins: Secondary | ICD-10-CM | POA: Diagnosis not present

## 2022-04-07 LAB — COPPER, SERUM: Copper: 124 ug/dL (ref 80–158)

## 2022-04-07 LAB — ZINC: Zinc: 74 ug/dL (ref 44–115)

## 2022-04-07 MED ORDER — CYANOCOBALAMIN 1000 MCG/ML IJ SOLN
1000.0000 ug | Freq: Once | INTRAMUSCULAR | Status: AC
  Administered 2022-04-07: 1000 ug via INTRAMUSCULAR
  Filled 2022-04-07: qty 1

## 2022-04-07 MED ORDER — SODIUM CHLORIDE 0.9 % IV SOLN
200.0000 mg | Freq: Once | INTRAVENOUS | Status: AC
  Administered 2022-04-07: 200 mg via INTRAVENOUS
  Filled 2022-04-07: qty 200

## 2022-04-07 MED ORDER — SODIUM CHLORIDE 0.9 % IV SOLN
Freq: Once | INTRAVENOUS | Status: AC
  Filled 2022-04-07: qty 250

## 2022-04-08 LAB — VITAMIN B1: Vitamin B1 (Thiamine): 111.4 nmol/L (ref 66.5–200.0)

## 2022-04-09 LAB — PROTEIN ELECTROPHORESIS, SERUM
A/G Ratio: 1.5 (ref 0.7–1.7)
Albumin ELP: 3.9 g/dL (ref 2.9–4.4)
Alpha-1-Globulin: 0.2 g/dL (ref 0.0–0.4)
Alpha-2-Globulin: 0.6 g/dL (ref 0.4–1.0)
Beta Globulin: 0.8 g/dL (ref 0.7–1.3)
Gamma Globulin: 1 g/dL (ref 0.4–1.8)
Globulin, Total: 2.6 g/dL (ref 2.2–3.9)
Total Protein ELP: 6.5 g/dL (ref 6.0–8.5)

## 2022-04-14 DIAGNOSIS — F411 Generalized anxiety disorder: Secondary | ICD-10-CM | POA: Diagnosis not present

## 2022-04-14 DIAGNOSIS — F909 Attention-deficit hyperactivity disorder, unspecified type: Secondary | ICD-10-CM | POA: Diagnosis not present

## 2022-04-17 ENCOUNTER — Other Ambulatory Visit: Payer: Self-pay | Admitting: Radiation Therapy

## 2022-04-20 ENCOUNTER — Ambulatory Visit
Admission: RE | Admit: 2022-04-20 | Discharge: 2022-04-20 | Disposition: A | Discharge status: Home / Self Care | Payer: Federal, State, Local not specified - PPO | Source: Ambulatory Visit | Attending: Internal Medicine | Admitting: Internal Medicine

## 2022-04-20 DIAGNOSIS — G8929 Other chronic pain: Secondary | ICD-10-CM | POA: Diagnosis not present

## 2022-04-20 DIAGNOSIS — R200 Anesthesia of skin: Secondary | ICD-10-CM

## 2022-04-20 DIAGNOSIS — M542 Cervicalgia: Secondary | ICD-10-CM | POA: Diagnosis not present

## 2022-04-20 MED ORDER — GADOBUTROL 1 MMOL/ML IV SOLN
6.0000 mL | Freq: Once | INTRAVENOUS | Status: AC | PRN
  Administered 2022-04-20: 7.5 mL via INTRAVENOUS

## 2022-04-21 ENCOUNTER — Inpatient Hospital Stay: Payer: Federal, State, Local not specified - PPO

## 2022-04-23 MED FILL — Iron Sucrose Inj 20 MG/ML (Fe Equiv): INTRAVENOUS | Qty: 10 | Status: AC

## 2022-04-24 ENCOUNTER — Inpatient Hospital Stay: Payer: Federal, State, Local not specified - PPO

## 2022-05-01 ENCOUNTER — Other Ambulatory Visit: Payer: Self-pay

## 2022-05-01 ENCOUNTER — Telehealth: Payer: Self-pay | Admitting: *Deleted

## 2022-05-01 ENCOUNTER — Inpatient Hospital Stay: Payer: Federal, State, Local not specified - PPO | Admitting: Internal Medicine

## 2022-05-01 VITALS — BP 115/72 | HR 68 | Temp 97.5°F | Resp 17 | Wt 140.4 lb

## 2022-05-01 DIAGNOSIS — R519 Headache, unspecified: Secondary | ICD-10-CM | POA: Diagnosis not present

## 2022-05-01 DIAGNOSIS — M5481 Occipital neuralgia: Secondary | ICD-10-CM | POA: Diagnosis not present

## 2022-05-01 DIAGNOSIS — M47812 Spondylosis without myelopathy or radiculopathy, cervical region: Secondary | ICD-10-CM | POA: Diagnosis not present

## 2022-05-01 DIAGNOSIS — D508 Other iron deficiency anemias: Secondary | ICD-10-CM | POA: Diagnosis not present

## 2022-05-01 DIAGNOSIS — Z79899 Other long term (current) drug therapy: Secondary | ICD-10-CM | POA: Diagnosis not present

## 2022-05-01 DIAGNOSIS — G6289 Other specified polyneuropathies: Secondary | ICD-10-CM | POA: Diagnosis not present

## 2022-05-01 DIAGNOSIS — Z9884 Bariatric surgery status: Secondary | ICD-10-CM | POA: Diagnosis not present

## 2022-05-01 DIAGNOSIS — G629 Polyneuropathy, unspecified: Secondary | ICD-10-CM | POA: Diagnosis not present

## 2022-05-01 DIAGNOSIS — E538 Deficiency of other specified B group vitamins: Secondary | ICD-10-CM | POA: Diagnosis not present

## 2022-05-01 MED ORDER — PREDNISONE 50 MG PO TABS: 50.0000 mg | ORAL_TABLET | Freq: Every day | ORAL | 0 refills | Status: DC

## 2022-05-01 MED ORDER — PREGABALIN 75 MG PO CAPS: 75.0000 mg | ORAL_CAPSULE | Freq: Two times a day (BID) | ORAL | 1 refills | Status: DC

## 2022-05-01 NOTE — Telephone Encounter (Signed)
Per Dr Mickeal Skinner, referral sent to Dr Davy Pique with [facility] - demographic sheet, referral, today's progress note & MRI report from 04/20/22 faxed to [phone].  Fax confirmation received.

## 2022-05-01 NOTE — Progress Notes (Signed)
[facility] at [place],  [address] [phone]   Interval Evaluation  Date of Service: 05/01/22 Patient Name: Nichole Gentry Patient MRN: [id number] Patient DOB: 04/25/1975 Provider: Ventura Sellers, MD  Identifying Statement:  Nichole Gentry is a 47 y.o. female with neuropathy   Interval History: Nichole Gentry presents today for follow up after bloodwork, c-spine MRI.  She describes worsening neck pain and lower back pain since this weekend.  She was very active and busy with her son's birthday party.  Currently she describes a severe headache "up the back of my neck" which is exacerbated by turning her head.  She dosed robaxan which did not help.  Neuropathy symptoms in hands and feet are not worse, they still "come and go".  H+P (04/03/22) Patient presents to review recent neuropathic complaints.  She describes ~1 year history of progressive numbness, tingling, pain in bottoms of feet and hands.  Symptoms are symmetric.  There is no marked weakness.  Also describes longer history of neck pain, more chronic.  There is sometimes shooting pain down an arm, this may lead her to drop things from her hand.  Previously had mri of the cervical spine but this was not followed up on, per patient.  History of roux-en-y bypass, doses vitamin B12 IM monthly.  Medications: Current Outpatient Medications on File Prior to Visit  Medication Sig Dispense Refill   amphetamine-dextroamphetamine (ADDERALL) 10 MG tablet Take 10 mg by mouth daily as needed.     Butalbital-APAP-Caffeine 50-325-40 MG capsule Take by mouth.     cyanocobalamin (,VITAMIN B-12,) 1000 MCG/ML injection Inject 1 mL (1,000 mcg total) into the muscle every 30 (thirty) days. Administer weekly x 3 and then monthly. 1 mL 4   LORazepam (ATIVAN) 0.5 MG tablet Take 0.5 mg by mouth 2 (two) times daily as needed.     methocarbamol (ROBAXIN) 750 MG tablet Take 1,500 mg by mouth 2 (two) times  daily.     Multiple Vitamin (MULTI-VITAMINS) TABS Take by mouth.     pantoprazole (PROTONIX) 40 MG tablet Take by mouth.     SUMAtriptan (IMITREX) 100 MG tablet Take by mouth.     SYRINGE-NEEDLE, DISP, 3 ML (BD SAFETYGLIDE SYRINGE/NEEDLE) 25G X 1" 3 ML MISC Use the needle for b12 injection once a month. 5 each 0   tranexamic acid (LYSTEDA) 650 MG TABS tablet Take 325 mg by mouth 2 (two) times daily.     VYVANSE 70 MG capsule TAKE 1 CAPSULE BY MOUTH IN THE MORNING ONCE DAILY FOR 30 DAYS  0   zolpidem (AMBIEN) 10 MG tablet Take by mouth.     hydrochlorothiazide (HYDRODIURIL) 12.5 MG tablet Take 12.5 mg by mouth daily. (Patient not taking: Reported on 09/20/2021)     ondansetron (ZOFRAN ODT) 4 MG disintegrating tablet Take 1 tablet (4 mg total) by mouth every 8 (eight) hours as needed for nausea or vomiting. (Patient not taking: Reported on 03/14/2021) 20 tablet 0   No current facility-administered medications on file prior to visit.    Allergies: No Known Allergies Past Medical History:  Past Medical History:  Diagnosis Date   ADD (attention deficit disorder)    Anemia    B12 deficiency    GERD (gastroesophageal reflux disease)    Low iron    Migraine    Simple febrile convulsions ([facility])    Past Surgical History:  Past Surgical History:  Procedure Laterality Date   ESOPHAGOGASTRODUODENOSCOPY (EGD)  WITH PROPOFOL N/A 11/07/2019   Procedure: ESOPHAGOGASTRODUODENOSCOPY (EGD) WITH PROPOFOL;  Surgeon: Lesly Rubenstein, MD;  Location: [facility] ENDOSCOPY;  Service: Endoscopy;  Laterality: N/A;   HIATAL HERNIA REPAIR     LAPAROSCOPIC REVISION OF ROUXENY WITH UPPER ENDOSCOPY     linx management system     Social History:  Social History   Socioeconomic History   Marital status: Single    Spouse name: Not on file   Number of children: Not on file   Years of education: Not on file   Highest education level: Not on file  Occupational History   Not on file  Tobacco Use   Smoking status:  Never   Smokeless tobacco: Never  Vaping Use   Vaping Use: Never used  Substance and Sexual Activity   Alcohol use: Yes    Comment: occasionally   Drug use: Never   Sexual activity: Yes    Birth control/protection: None  Other Topics Concern   Not on file  Social History Narrative   Not on file   Social Determinants of Health   Financial Resource Strain: Not on file  Food Insecurity: Food Insecurity Present (04/03/2022)   Hunger Vital Sign    Worried About Running Out of Food in the Last Year: Sometimes true    Ran Out of Food in the Last Year: Sometimes true  Transportation Needs: No Transportation Needs (04/03/2022)   PRAPARE - Hydrologist (Medical): No    Lack of Transportation (Non-Medical): No  Physical Activity: Not on file  Stress: Not on file  Social Connections: Not on file  Intimate Partner Violence: Not At Risk (04/03/2022)   Humiliation, Afraid, Rape, and Kick questionnaire    Fear of Current or Ex-Partner: No    Emotionally Abused: No    Physically Abused: No    Sexually Abused: No   Family History:  Family History  Problem Relation Age of Onset   Thyroid disease Mother    Diabetes Mother    Hyperlipidemia Father    Lung cancer Father    Brain cancer Father     Review of Systems: Constitutional: Doesn't report fevers, chills or abnormal weight loss Eyes: Doesn't report blurriness of vision Ears, nose, mouth, throat, and face: Doesn't report sore throat Respiratory: Doesn't report cough, dyspnea or wheezes Cardiovascular: Doesn't report palpitation, chest discomfort  Gastrointestinal:  Doesn't report nausea, constipation, diarrhea GU: Doesn't report incontinence Skin: Doesn't report skin rashes Neurological: Per HPI Musculoskeletal: Doesn't report joint pain Behavioral/Psych: Doesn't report anxiety  Physical Exam: Vitals:   05/01/22 1009  BP: 115/72  Pulse: 68  Resp: 17  Temp: (!) 97.5 F (36.4 C)  SpO2: 96%    KPS: 80. General: Alert, cooperative, pleasant, in no acute distress Head: Normal EENT: No conjunctival injection or scleral icterus.  Lungs: Resp effort normal Cardiac: Regular rate Abdomen: Non-distended abdomen Skin: No rashes cyanosis or petechiae. Extremities: No clubbing or edema  Neurologic Exam: Mental Status: Awake, alert, attentive to examiner. Oriented to self and environment. Language is fluent with intact comprehension.  Cranial Nerves: Visual acuity is grossly normal. Visual fields are full. Extra-ocular movements intact. No ptosis. Face is symmetric Motor: Tone and bulk are normal. Power is full in both arms and legs. Reflexes are symmetric, no pathologic reflexes present.  Sensory: Intact to light touch Gait: Normal.   Labs: I have reviewed the data as listed    Component Value Date/Time   NA 136 08/29/2020 0856  K 3.7 08/29/2020 0856   CL 103 08/29/2020 0856   CO2 27 08/29/2020 0856   GLUCOSE 100 (H) 08/29/2020 0856   BUN 19 08/29/2020 0856   CREATININE 0.88 08/29/2020 0856   CALCIUM 8.4 (L) 08/29/2020 0856   PROT 6.9 08/29/2020 0856   ALBUMIN 4.1 08/29/2020 0856   AST 18 08/29/2020 0856   ALT 15 08/29/2020 0856   ALKPHOS 62 08/29/2020 0856   BILITOT 0.7 08/29/2020 0856   GFRNONAA >60 08/29/2020 0856   GFRAA >60 06/30/2017 1227   Lab Results  Component Value Date   WBC 4.6 03/24/2022   NEUTROABS 2.4 03/24/2022   HGB 13.7 03/24/2022   HCT 41.2 03/24/2022   MCV 91.6 03/24/2022   PLT 238 03/24/2022   Imaging:   MR CERVICAL SPINE W WO CONTRAST  Result Date: 04/20/2022 CLINICAL DATA:  Brain/CNS neoplasm. Assess treatment response. Neck pain for the last year, progressively worsening. Bilateral hand burning. EXAM: MRI CERVICAL SPINE WITHOUT AND WITH CONTRAST TECHNIQUE: Multiplanar and multiecho pulse sequences of the cervical spine, to include the craniocervical junction and cervicothoracic junction, were obtained without and with intravenous  contrast. CONTRAST:  7.10mL GADAVIST GADOBUTROL 1 MMOL/ML IV SOLN COMPARISON:  None Available. FINDINGS: Alignment: Straightening of the normal cervical lordosis. This could simply be positional. Vertebrae: No fracture or focal bone lesion. Mild degenerative endplate edematous change posteriorly at C6-7. This could possibly relate to regional pain. Cord: No cord compression or focal cord lesion. Posterior Fossa, vertebral arteries, paraspinal tissues: Negative Disc levels: Foramen magnum, C1-2 and C2-3 levels are normal. From C3-4 through C7-T1, there are small endplate osteophytes and there are disc bulges which narrow the ventral subarachnoid space but do not compress the cord at any level. Minimal AP diameter of the canal is at the C6-7 level measuring 8.3 mm. Mild bilateral foraminal at C4-5, C5-6 and C6-7 but no compressive stenosis. No facet arthropathy. No abnormal contrast enhancement. IMPRESSION: 1. Degenerative spondylosis from C3-4 through C7-T1. No compressive canal stenosis. Mild bilateral foraminal narrowing at C4-5, C5-6 and C6-7 but without visible neural compression. 2. Mild degenerative endplate edematous change at C6-7 which could relate to regional pain. Electronically Signed   By: Nelson Chimes M.D.   On: 04/20/2022 12:45   Assessment/Plan Other polyneuropathy  Nichole Gentry presents with clinical syndrome consistent with symmetric, length dependent, small and large fiber peripheral neuropathy.  Etiology is unknown.  Risk factors may include history of gastric bypass surgery, vitamin/mineral deficiency.  She has also not been screened for diabetes in recent years.   Serum workup was unremarkable aside from Vitamin D deficiency.   MRI cervical spine demonstrated moderate spondylitic changes.  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  We recommended referral to [facility] for evaluation and potential nerve block.  In meantime, recommended short course of oral steroids for  inflammation, prednisone 50mg  daily x7 days.  Many with trial of Lyrica 75mg  BID for neuropathy symptoms.  We appreciate the opportunity to participate in the care of Nichole Gentry.   We ask that Nichole Gentry return to clinic in as needed.  All questions were answered. The patient knows to call the clinic with any problems, questions or concerns. No barriers to learning were detected.  The total time spent in the encounter was 30 minutes and more than 50% was on counseling and review of test results   Ventura Sellers, MD Medical Director of Neuro-Oncology [facility] [place] 05/01/22 10:20 AM

## 2022-05-12 DIAGNOSIS — M2012 Hallux valgus (acquired), left foot: Secondary | ICD-10-CM | POA: Diagnosis not present

## 2022-05-12 DIAGNOSIS — M2011 Hallux valgus (acquired), right foot: Secondary | ICD-10-CM | POA: Diagnosis not present

## 2022-05-26 ENCOUNTER — Inpatient Hospital Stay: Payer: Federal, State, Local not specified - PPO | Attending: Internal Medicine

## 2022-05-28 DIAGNOSIS — M2011 Hallux valgus (acquired), right foot: Secondary | ICD-10-CM | POA: Diagnosis not present

## 2022-06-02 DIAGNOSIS — M5481 Occipital neuralgia: Secondary | ICD-10-CM | POA: Diagnosis not present

## 2022-06-02 DIAGNOSIS — M47812 Spondylosis without myelopathy or radiculopathy, cervical region: Secondary | ICD-10-CM | POA: Diagnosis not present

## 2022-06-12 ENCOUNTER — Encounter: Payer: Self-pay | Admitting: Podiatry

## 2022-06-12 NOTE — Discharge Instructions (Signed)
[facility]  POST OPERATIVE INSTRUCTIONS FOR DR. FOWLER AND [facility] P[facility]   Take your medication as prescribed.  Pain medication should be taken only as needed.  Keep the dressing clean, dry and intact.  Keep your foot elevated above the heart level for the first 48 hours.  Walking to the bathroom and brief periods of walking are acceptable, unless we have instructed you to be non-weight bearing.  Always wear your post-op shoe when walking.  Always use your crutches if you are to be non-weight bearing.  Do not take a shower. Baths are permissible as long as the foot is kept out of the water.   Every hour you are awake:  Bend your knee 15 times. Flex foot 15 times Massage calf 15 times  Call [facility] ([phone]) if any of the following problems occur: You develop a temperature or fever. The bandage becomes saturated with blood. Medication does not stop your pain. Injury of the foot occurs. Any symptoms of infection including redness, odor, or red streaks running from wound. 

## 2022-06-13 NOTE — Anesthesia Preprocedure Evaluation (Signed)
Anesthesia Evaluation  Patient identified by MRN, date of birth, ID band Patient awake    Reviewed: Allergy & Precautions, H&P , NPO status , Patient's Chart, lab work & pertinent test results  Airway Mallampati: I  TM Distance: >3 FB Neck ROM: Full    Dental no notable dental hx.    Pulmonary neg pulmonary ROS   Pulmonary exam normal breath sounds clear to auscultation       Cardiovascular negative cardio ROS Normal cardiovascular exam Rhythm:Regular Rate:Normal     Neuro/Psych  Headaches, Seizures -,  PSYCHIATRIC DISORDERS       Neuromuscular disease  negative psych ROS   GI/Hepatic Neg liver ROS,GERD  Poorly Controlled,,Has had fundoplication x 2 but still has GERD, took meds today   Endo/Other  negative endocrine ROS    Renal/GU negative Renal ROS  negative genitourinary   Musculoskeletal negative musculoskeletal ROS (+)    Abdominal   Peds negative pediatric ROS (+)  Hematology  (+) Blood dyscrasia, anemia   Anesthesia Other Findings GERD (gastroesophageal reflux disease) ADD (attention deficit disorder) Migraine  Anemia Simple febrile convulsions ([facility])  Low iron B12 deficiency     Reproductive/Obstetrics negative OB ROS                              Anesthesia Physical Anesthesia Plan  ASA: 2  Anesthesia Plan: General ETT   Post-op Pain Management:    Induction: Intravenous  PONV Risk Score and Plan:   Airway Management Planned: Oral ETT  Additional Equipment:   Intra-op Plan:   Post-operative Plan: Extubation in OR  Informed Consent: I have reviewed the patients History and Physical, chart, labs and discussed the procedure including the risks, benefits and alternatives for the proposed anesthesia with the patient or authorized representative who has indicated his/her understanding and acceptance.     Dental Advisory Given  Plan Discussed with:  Anesthesiologist, CRNA and Surgeon  Anesthesia Plan Comments: (Patient consented for risks of anesthesia including but not limited to:  - adverse reactions to medications - damage to eyes, teeth, lips or other oral mucosa - nerve damage due to positioning  - sore throat or hoarseness - Damage to heart, brain, nerves, lungs, other parts of body or loss of life  Patient voiced understanding.)        Anesthesia Quick Evaluation

## 2022-06-17 DIAGNOSIS — M47812 Spondylosis without myelopathy or radiculopathy, cervical region: Secondary | ICD-10-CM | POA: Diagnosis not present

## 2022-06-18 ENCOUNTER — Other Ambulatory Visit: Payer: Self-pay | Admitting: Podiatry

## 2022-06-18 ENCOUNTER — Encounter
Admission: RE | Disposition: A | Discharge status: Home / Self Care | Payer: Self-pay | Source: Home / Self Care | Attending: Podiatry

## 2022-06-18 ENCOUNTER — Ambulatory Visit: Payer: Federal, State, Local not specified - PPO | Admitting: Anesthesiology

## 2022-06-18 ENCOUNTER — Ambulatory Visit: Payer: Self-pay

## 2022-06-18 ENCOUNTER — Encounter: Payer: Self-pay | Admitting: Podiatry

## 2022-06-18 ENCOUNTER — Other Ambulatory Visit: Payer: Self-pay

## 2022-06-18 ENCOUNTER — Ambulatory Visit
Admission: RE | Admit: 2022-06-18 | Discharge: 2022-06-18 | Disposition: A | Discharge status: Home / Self Care | Payer: Federal, State, Local not specified - PPO | Attending: Podiatry | Admitting: Podiatry

## 2022-06-18 DIAGNOSIS — M2011 Hallux valgus (acquired), right foot: Secondary | ICD-10-CM | POA: Diagnosis not present

## 2022-06-18 DIAGNOSIS — F988 Other specified behavioral and emotional disorders with onset usually occurring in childhood and adolescence: Secondary | ICD-10-CM | POA: Diagnosis not present

## 2022-06-18 DIAGNOSIS — G43909 Migraine, unspecified, not intractable, without status migrainosus: Secondary | ICD-10-CM | POA: Diagnosis not present

## 2022-06-18 DIAGNOSIS — E538 Deficiency of other specified B group vitamins: Secondary | ICD-10-CM | POA: Diagnosis not present

## 2022-06-18 DIAGNOSIS — D649 Anemia, unspecified: Secondary | ICD-10-CM | POA: Diagnosis not present

## 2022-06-18 DIAGNOSIS — R519 Headache, unspecified: Secondary | ICD-10-CM | POA: Insufficient documentation

## 2022-06-18 DIAGNOSIS — K219 Gastro-esophageal reflux disease without esophagitis: Secondary | ICD-10-CM | POA: Diagnosis not present

## 2022-06-18 DIAGNOSIS — R569 Unspecified convulsions: Secondary | ICD-10-CM | POA: Diagnosis not present

## 2022-06-18 HISTORY — PX: BUNIONECTOMY: SHX129

## 2022-06-18 LAB — POCT PREGNANCY, URINE: Preg Test, Ur: NEGATIVE

## 2022-06-18 SURGERY — BUNIONECTOMY
Anesthesia: General | Site: Toe | Laterality: Right

## 2022-06-18 MED ORDER — SUCCINYLCHOLINE CHLORIDE 200 MG/10ML IV SOSY
PREFILLED_SYRINGE | INTRAVENOUS | Status: DC | PRN
  Administered 2022-06-18: 100 mg via INTRAVENOUS

## 2022-06-18 MED ORDER — SODIUM CHLORIDE 0.9 % IR SOLN
Status: DC | PRN
  Administered 2022-06-18: 5000 mL

## 2022-06-18 MED ORDER — PROPOFOL 10 MG/ML IV BOLUS
INTRAVENOUS | Status: DC | PRN
  Administered 2022-06-18: 50 ug/kg/min via INTRAVENOUS
  Administered 2022-06-18: 180 mg via INTRAVENOUS

## 2022-06-18 MED ORDER — OXYCODONE-ACETAMINOPHEN 5-325 MG PO TABS: 1.0000 | ORAL_TABLET | Freq: Four times a day (QID) | ORAL | 0 refills | Status: DC | PRN

## 2022-06-18 MED ORDER — DEXAMETHASONE SODIUM PHOSPHATE 4 MG/ML IJ SOLN
INTRAMUSCULAR | Status: DC | PRN
  Administered 2022-06-18: 8 mg via INTRAVENOUS

## 2022-06-18 MED ORDER — DEXMEDETOMIDINE HCL IN NACL 200 MCG/50ML IV SOLN
INTRAVENOUS | Status: DC | PRN
  Administered 2022-06-18: 8 ug via INTRAVENOUS

## 2022-06-18 MED ORDER — ONDANSETRON HCL 4 MG/2ML IJ SOLN: 4.0000 mg | Freq: Four times a day (QID) | INTRAMUSCULAR | Status: DC | PRN

## 2022-06-18 MED ORDER — CEFAZOLIN SODIUM-DEXTROSE 2-4 GM/100ML-% IV SOLN
2.0000 g | INTRAVENOUS | Status: AC
  Administered 2022-06-18: 2 g via INTRAVENOUS

## 2022-06-18 MED ORDER — BUPIVACAINE HCL (PF) 0.25 % IJ SOLN
INTRAMUSCULAR | Status: DC | PRN
  Administered 2022-06-18: 10 mL

## 2022-06-18 MED ORDER — ONDANSETRON HCL 4 MG/2ML IJ SOLN
INTRAMUSCULAR | Status: DC | PRN
  Administered 2022-06-18: 4 mg via INTRAVENOUS

## 2022-06-18 MED ORDER — BUPIVACAINE LIPOSOME 1.3 % IJ SUSP
INTRAMUSCULAR | Status: DC | PRN
  Administered 2022-06-18: 10 mL

## 2022-06-18 MED ORDER — METOCLOPRAMIDE HCL 5 MG/ML IJ SOLN: 5.0000 mg | Freq: Three times a day (TID) | INTRAMUSCULAR | Status: DC | PRN

## 2022-06-18 MED ORDER — MIDAZOLAM HCL 5 MG/5ML IJ SOLN
INTRAMUSCULAR | Status: DC | PRN
  Administered 2022-06-18: 2 mg via INTRAVENOUS

## 2022-06-18 MED ORDER — ONDANSETRON HCL 4 MG PO TABS: 4.0000 mg | ORAL_TABLET | Freq: Four times a day (QID) | ORAL | Status: DC | PRN

## 2022-06-18 MED ORDER — METOCLOPRAMIDE HCL 5 MG PO TABS: 5.0000 mg | ORAL_TABLET | Freq: Three times a day (TID) | ORAL | Status: DC | PRN

## 2022-06-18 MED ORDER — ACETAMINOPHEN 10 MG/ML IV SOLN
INTRAVENOUS | Status: DC | PRN
  Administered 2022-06-18: 1000 mg via INTRAVENOUS

## 2022-06-18 MED ORDER — OXYCODONE HCL 5 MG PO TABS
10.0000 mg | ORAL_TABLET | Freq: Once | ORAL | Status: AC
  Administered 2022-06-18: 10 mg via ORAL

## 2022-06-18 MED ORDER — LACTATED RINGERS IV SOLN: INTRAVENOUS | Status: DC

## 2022-06-18 MED ORDER — SCOPOLAMINE 1 MG/3DAYS TD PT72
1.0000 | MEDICATED_PATCH | Freq: Once | TRANSDERMAL | Status: DC
  Administered 2022-06-18: 1.5 mg via TRANSDERMAL

## 2022-06-18 MED ORDER — LIDOCAINE HCL (CARDIAC) PF 100 MG/5ML IV SOSY
PREFILLED_SYRINGE | INTRAVENOUS | Status: DC | PRN
  Administered 2022-06-18: 50 mg via INTRAVENOUS

## 2022-06-18 MED ORDER — FENTANYL CITRATE (PF) 100 MCG/2ML IJ SOLN
INTRAMUSCULAR | Status: DC | PRN
  Administered 2022-06-18: 100 ug via INTRAVENOUS

## 2022-06-18 SURGICAL SUPPLY — 49 items
APL SKNCLS STERI-STRIP NONHPOA (GAUZE/BANDAGES/DRESSINGS) ×1
BENZOIN TINCTURE PRP APPL 2/3 (GAUZE/BANDAGES/DRESSINGS) ×1 IMPLANT
BLADE SAW LAPIPLASTY 40X11 (BLADE) IMPLANT
BLADE SURG 15 STRL LF DISP TIS (BLADE) IMPLANT
BLADE SURG 15 STRL SS (BLADE) ×1
BNDG CMPR 5X4 CHSV STRCH STRL (GAUZE/BANDAGES/DRESSINGS) ×1
BNDG CMPR 75X41 PLY HI ABS (GAUZE/BANDAGES/DRESSINGS) ×1
BNDG CMPR STD VLCR NS LF 5.8X4 (GAUZE/BANDAGES/DRESSINGS) ×1
BNDG COHESIVE 4X5 TAN STRL LF (GAUZE/BANDAGES/DRESSINGS) ×1 IMPLANT
BNDG ELASTIC 4X5.8 VLCR NS LF (GAUZE/BANDAGES/DRESSINGS) ×1 IMPLANT
BNDG ESMARCH 4 X 12 STRL LF (GAUZE/BANDAGES/DRESSINGS) ×1
BNDG ESMARCH 4X12 STRL LF (GAUZE/BANDAGES/DRESSINGS) ×1 IMPLANT
BNDG GAUZE DERMACEA FLUFF 4 (GAUZE/BANDAGES/DRESSINGS) ×1 IMPLANT
BNDG GZE DERMACEA 4 6PLY (GAUZE/BANDAGES/DRESSINGS) ×1
BNDG STRETCH 4X75 STRL LF (GAUZE/BANDAGES/DRESSINGS) ×1 IMPLANT
BOOT STEPPER DURA MED (SOFTGOODS) IMPLANT
CANISTER SUCT 1200ML W/VALVE (MISCELLANEOUS) ×1 IMPLANT
COVER LIGHT HANDLE UNIVERSAL (MISCELLANEOUS) ×2 IMPLANT
CUFF TOURN SGL QUICK 18X4 (TOURNIQUET CUFF) IMPLANT
DRAPE FLUOR MINI C-ARM 54X84 (DRAPES) ×1 IMPLANT
DURAPREP 26ML APPLICATOR (WOUND CARE) ×1 IMPLANT
ELECT REM PT RETURN 9FT ADLT (ELECTROSURGICAL) ×1
ELECTRODE REM PT RTRN 9FT ADLT (ELECTROSURGICAL) ×1 IMPLANT
GAUZE SPONGE 4X4 12PLY STRL (GAUZE/BANDAGES/DRESSINGS) ×1 IMPLANT
GAUZE XEROFORM 1X8 LF (GAUZE/BANDAGES/DRESSINGS) ×1 IMPLANT
GLOVE SRG 8 PF TXTR STRL LF DI (GLOVE) ×2 IMPLANT
GLOVE SURG ENC MOIS LTX SZ7.5 (GLOVE) ×2 IMPLANT
GLOVE SURG UNDER POLY LF SZ8 (GLOVE) ×2
GOWN SPEC L4 XLG W/TWL (GOWN DISPOSABLE) ×1 IMPLANT
GOWN STRL REUS W/ TWL LRG LVL3 (GOWN DISPOSABLE) ×2 IMPLANT
GOWN STRL REUS W/TWL LRG LVL3 (GOWN DISPOSABLE) ×2
KIT TURNOVER KIT A (KITS) ×1 IMPLANT
LAPIPLASTY SYS 4A (Orthopedic Implant) ×1 IMPLANT
NS IRRIG 500ML POUR BTL (IV SOLUTION) ×1 IMPLANT
PACK EXTREMITY ARMC (MISCELLANEOUS) ×1 IMPLANT
RASP SM TEAR CROSS CUT (RASP) IMPLANT
SCREW 2.7 HIGH PITCH LOCKING (Screw) IMPLANT
SCREW HIGH PITCH LOCK 2.7 (Screw) IMPLANT
STOCKINETTE IMPERVIOUS LG (DRAPES) ×1 IMPLANT
STRIP CLOSURE SKIN 1/4X4 (GAUZE/BANDAGES/DRESSINGS) ×1 IMPLANT
SUT ETHILON 4 0 PS 2 18 (SUTURE) IMPLANT
SUT MNCRL 4-0 (SUTURE) ×1
SUT MNCRL 4-0 27XMFL (SUTURE) ×1
SUT VIC AB 3-0 SH 27 (SUTURE) ×1
SUT VIC AB 3-0 SH 27X BRD (SUTURE) IMPLANT
SUT VIC AB 4-0 SH 27 (SUTURE) ×1
SUT VIC AB 4-0 SH 27XANBCTRL (SUTURE) IMPLANT
SUTURE MNCRL 4-0 27XMF (SUTURE) ×1 IMPLANT
SYSTEM LAPIPLASTY 4A (Orthopedic Implant) IMPLANT

## 2022-06-18 NOTE — H&P (Signed)
HISTORY AND PHYSICAL INTERVAL NOTE:  06/18/2022  11:54 AM  Nichole Gentry  has presented today for surgery, with the diagnosis of M20.11 - Hallux valgus of right foot.  The various methods of treatment have been discussed with the patient.  No guarantees were given.  After consideration of risks, benefits and other options for treatment, the patient has consented to surgery.  I have reviewed the patients' chart and labs.     A history and physical examination was performed in my office.  The patient was reexamined.  There have been no changes to this history and physical examination.  Nichole Gentry A

## 2022-06-18 NOTE — Op Note (Signed)
Operative note   Surgeon:Jerene Yeager [employer] logistics/support/administrative officer: None    Preop diagnosis: Hallux valgus deformity right foot    Postop diagnosis: Same    Procedure: Lapidus hallux valgus correction right foot    EBL: Minimal    Anesthesia:local and general.  Local consisted of a one-to-one mixture of 0.25% plain bupivacaine and Exparel long-acting anesthetic.  A total of 20 cc was used in a local block fashion    Hemostasis: Midcalf tourniquet inflated to 200 mmHg for approximately 75 minutes    Specimen: None    Complications: None    Operative indications:Nichole Gentry is an 47 y.o. that presents today for surgical intervention.  The risks/benefits/alternatives/complications have been discussed and consent has been given.    Procedure:  Patient was brought into the OR and placed on the operating table in thesupine position. After anesthesia was obtained theright lower extremity was prepped and draped in usual sterile fashion.  Attention was directed to the dorsal aspect of the foot where a dorsal incision was made at the first met cuneiforms joint.  Sharp and blunt dissection was carried down to the periosteum.  Subperiosteal dissection was then undertaken.  This exposed the first met cuneiform joint.  This was then freed and loosened.  A small fulcrum was placed between the base of the first metatarsal and second metatarsal.  Next the joint positioner was placed on the medial aspect of the metatarsal.  A small stab incision was made at the second metatarsal.  Compression was placed for the positioner.  Good realignment of the first intermetatarsal angle was noted at this time.  Attention was then directed to the dorsomedial first MTPJ where an incision was performed.  Sharp and blunt dissection was carried down to the capsule.  The intermetatarsal space was then entered.  The conjoined tendon of the abductor was then freed from the base of the proximal phalanx.  Attention was to redirected to  the first met cuneiform joint.  At this time the osteotomy cut guide was placed into the joint region.  2 vertical cuts were then made.  The cartilage material was removed from the first met cuneiform joint and the joint was then prepped with a 2.0 mm drill bit.  The joint compressor was then placed.  Good compression and realignment was noted.  Next the medial and dorsal locking plates were then placed from the lapiplasty set.  Realignment and stability was noted in all planes.  Attention was redirected to the dorsomedial first MTPJ.  A small T capsulotomy was performed.  The dorsomedial eminence was noted and transected and smoothed with a power rasp.  A small capsulorrhaphy was performed.  Closure was then performed after all areas were irrigated.  3-0 Vicryl for the capsular tissue.  4-0 Vicryl in subcutaneous tissue and a 4-0 Monocryl for the skin.   Further local anesthesia was performed at the end of the case.    Patient tolerated the procedure and anesthesia well.  Was transported from the OR to the PACU with all vital signs stable and vascular status intact. To be discharged per routine protocol.  Will follow up in approximately 1 week in the outpatient clinic.

## 2022-06-18 NOTE — Transfer of Care (Signed)
Immediate Anesthesia Transfer of Care Note  Patient: Nichole Gentry  Procedure(s) Performed: BUNIONECTOMY LAPIDUS TYPE (Right: Toe)  Patient Location: PACU  Anesthesia Type: General ETT  Level of Consciousness: awake, alert  and patient cooperative  Airway and Oxygen Therapy: Patient Spontanous Breathing and Patient connected to supplemental oxygen  Post-op Assessment: Post-op Vital signs reviewed, Patient's Cardiovascular Status Stable, Respiratory Function Stable, Patent Airway and No signs of Nausea or vomiting  Post-op Vital Signs: Reviewed and stable  Complications: No notable events documented.

## 2022-06-18 NOTE — Anesthesia Procedure Notes (Signed)
Procedure Name: Intubation Date/Time: 06/18/2022 12:10 PM  Performed by: Domenic Moras, CRNAPre-anesthesia Checklist: Patient identified, Emergency Drugs available, Suction available and Patient being monitored Patient Re-evaluated:Patient Re-evaluated prior to induction Oxygen Delivery Method: Circle system utilized Preoxygenation: Pre-oxygenation with 100% oxygen Induction Type: IV induction Ventilation: Mask ventilation without difficulty Laryngoscope Size: Mac and 3 Grade View: Grade I Tube type: Oral Tube size: 7.0 mm Number of attempts: 1 Airway Equipment and Method: Stylet and Oral airway Placement Confirmation: ETT inserted through vocal cords under direct vision, positive ETCO2 and breath sounds checked- equal and bilateral Secured at: 20 cm Tube secured with: Tape Dental Injury: Teeth and Oropharynx as per pre-operative assessment

## 2022-06-19 ENCOUNTER — Encounter: Payer: Self-pay | Admitting: Podiatry

## 2022-06-19 NOTE — Anesthesia Postprocedure Evaluation (Signed)
Anesthesia Post Note  Patient: Nichole Gentry  Procedure(s) Performed: BUNIONECTOMY LAPIDUS TYPE (Right: Toe)  Patient location during evaluation: PACU Anesthesia Type: General Level of consciousness: awake and alert Pain management: pain level controlled Vital Signs Assessment: post-procedure vital signs reviewed and stable Respiratory status: spontaneous breathing, nonlabored ventilation, respiratory function stable and patient connected to nasal cannula oxygen Cardiovascular status: blood pressure returned to baseline and stable Postop Assessment: no apparent nausea or vomiting Anesthetic complications: no   No notable events documented.   Last Vitals:  Vitals:   06/18/22 1405 06/18/22 1415  BP:    Pulse: 75 65  Resp: 18 16  Temp:  36.8 C  SpO2: 100% 99%    Last Pain:  Vitals:   06/18/22 1415  PainSc: 4                  Lella Mullany C Sarath Privott

## 2022-06-24 ENCOUNTER — Inpatient Hospital Stay: Payer: Federal, State, Local not specified - PPO | Attending: Internal Medicine

## 2022-06-25 DIAGNOSIS — Z9889 Other specified postprocedural states: Secondary | ICD-10-CM | POA: Diagnosis not present

## 2022-06-25 DIAGNOSIS — M2011 Hallux valgus (acquired), right foot: Secondary | ICD-10-CM | POA: Diagnosis not present

## 2022-07-01 DIAGNOSIS — M47812 Spondylosis without myelopathy or radiculopathy, cervical region: Secondary | ICD-10-CM | POA: Diagnosis not present

## 2022-07-07 DIAGNOSIS — M47812 Spondylosis without myelopathy or radiculopathy, cervical region: Secondary | ICD-10-CM | POA: Diagnosis not present

## 2022-07-21 DIAGNOSIS — F909 Attention-deficit hyperactivity disorder, unspecified type: Secondary | ICD-10-CM | POA: Diagnosis not present

## 2022-07-21 DIAGNOSIS — F411 Generalized anxiety disorder: Secondary | ICD-10-CM | POA: Diagnosis not present

## 2022-07-25 ENCOUNTER — Inpatient Hospital Stay: Payer: Federal, State, Local not specified - PPO

## 2022-07-25 ENCOUNTER — Inpatient Hospital Stay: Payer: Federal, State, Local not specified - PPO | Attending: Internal Medicine

## 2022-07-25 DIAGNOSIS — E538 Deficiency of other specified B group vitamins: Secondary | ICD-10-CM | POA: Insufficient documentation

## 2022-07-25 DIAGNOSIS — Z79899 Other long term (current) drug therapy: Secondary | ICD-10-CM | POA: Insufficient documentation

## 2022-07-25 DIAGNOSIS — D508 Other iron deficiency anemias: Secondary | ICD-10-CM | POA: Insufficient documentation

## 2022-07-25 MED ORDER — CYANOCOBALAMIN 1000 MCG/ML IJ SOLN
1000.0000 ug | Freq: Once | INTRAMUSCULAR | Status: AC
  Administered 2022-07-25: 1000 ug via INTRAMUSCULAR
  Filled 2022-07-25: qty 1

## 2022-07-30 DIAGNOSIS — M2011 Hallux valgus (acquired), right foot: Secondary | ICD-10-CM | POA: Diagnosis not present

## 2022-08-11 DIAGNOSIS — M47812 Spondylosis without myelopathy or radiculopathy, cervical region: Secondary | ICD-10-CM | POA: Diagnosis not present

## 2022-08-25 ENCOUNTER — Inpatient Hospital Stay: Payer: Federal, State, Local not specified - PPO | Attending: Internal Medicine

## 2022-08-25 DIAGNOSIS — E538 Deficiency of other specified B group vitamins: Secondary | ICD-10-CM | POA: Diagnosis not present

## 2022-08-25 DIAGNOSIS — K9589 Other complications of other bariatric procedure: Secondary | ICD-10-CM

## 2022-08-25 MED ORDER — CYANOCOBALAMIN 1000 MCG/ML IJ SOLN
1000.0000 ug | Freq: Once | INTRAMUSCULAR | Status: AC
  Administered 2022-08-25: 1000 ug via INTRAMUSCULAR
  Filled 2022-08-25: qty 1

## 2022-09-04 NOTE — Progress Notes (Unsigned)
GYNECOLOGY PROGRESS NOTE  Subjective:    Patient ID: Nichole Gentry, female    DOB: 08-Mar-1975, 47 y.o.   MRN: [id number]  HPI  Patient is a 47 y.o. G65P0 female who presents for menorrhagia. She has been having a menstrual cycle every 2 weeks. She states that her cycle comes every time she gets a vitamin B-12 injection or if she goes without the injection to long. She had a cycle 3 times in July. She feels very moody and irritable. She has a family history of thyroid disease and cancer with her mom, son and sister. Her son had thyroid cancer. She has been having lose watery stools.   Period Pattern: (!) Irregular Menstrual Flow: Moderate Dysmenorrhea: (!) Moderate Dysmenorrhea Symptoms: Cramping, Other (Comment) (back pain)   The following portions of the patient's history were reviewed and updated as appropriate:  She  has a past medical history of ADD (attention deficit disorder), Anemia, B12 deficiency, GERD (gastroesophageal reflux disease), Low iron, Migraine, and Simple febrile convulsions (HCC).  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  Her family history includes Brain cancer in her father; Diabetes in her mother; Hyperlipidemia in her father; Lung cancer in her father; Thyroid disease in her mother.  She  reports that she has never smoked. She has never used smokeless tobacco. She reports current alcohol use. She reports that she does not use drugs.  Current Outpatient Medications on File Prior to Visit  Medication Sig Dispense Refill   amphetamine-dextroamphetamine (ADDERALL) 10 MG tablet Take 10 mg by mouth daily as needed.     Butalbital-APAP-Caffeine 50-325-40 MG capsule Take by mouth as needed for headache.     cyanocobalamin (,VITAMIN B-12,) 1000 MCG/ML injection  Inject 1 mL (1,000 mcg total) into the muscle every 30 (thirty) days. Administer weekly x 3 and then monthly. 1 mL 4   LORazepam (ATIVAN) 0.5 MG tablet Take 0.5 mg by mouth 2 (two) times daily as needed.     pantoprazole (PROTONIX) 40 MG tablet Take 40 mg by mouth daily.     pregabalin (LYRICA) 75 MG capsule Take 1 capsule (75 mg total) by mouth 2 (two) times daily. (Patient taking differently: Take 75 mg by mouth 2 (two) times daily as needed.) 60 capsule 1   SYRINGE-NEEDLE, DISP, 3 ML (BD SAFETYGLIDE SYRINGE/NEEDLE) 25G X 1" 3 ML MISC Use the needle for b12 injection once a month. 5 each 0   tranexamic acid (LYSTEDA) 650 MG TABS tablet Take 325 mg by mouth 2 (two) times daily as needed.     VYVANSE 70 MG capsule TAKE 1 CAPSULE BY MOUTH IN THE MORNING ONCE DAILY FOR 30 DAYS  0   zolpidem (AMBIEN) 10 MG tablet Take 10 mg by mouth at bedtime as needed.     No current facility-administered medications on file prior to visit.   She has No Known Allergies..  Review of Systems Pertinent items are noted in HPI.   Objective:   Blood pressure (!) 129/94, pulse (!) 101, resp. rate 16, height 5\' 6"  (1.676 m), weight 140 lb 9.6 oz (63.8 kg), last menstrual period 08/24/2022. Body mass index is 22.69 kg/m. General appearance: alert, cooperative, and no distress Abdomen: {abdominal exam:16834} Pelvic: {pelvic exam:16852::"cervix normal in appearance","external genitalia normal","no adnexal masses or tenderness","no cervical motion tenderness","rectovaginal septum normal","uterus normal size, shape, and consistency","vagina  normal without discharge"} Extremities: {extremity exam:5109} Neurologic: {neuro exam:17854}   Assessment:   No diagnosis found.   Plan:   There are no diagnoses linked to this encounter.    Hildred Laser, MD [facility]

## 2022-09-05 ENCOUNTER — Other Ambulatory Visit (HOSPITAL_COMMUNITY)
Admission: RE | Admit: 2022-09-05 | Discharge: 2022-09-05 | Disposition: A | Discharge status: Home / Self Care | Payer: Federal, State, Local not specified - PPO | Source: Ambulatory Visit | Attending: Obstetrics and Gynecology | Admitting: Obstetrics and Gynecology

## 2022-09-05 ENCOUNTER — Ambulatory Visit (INDEPENDENT_AMBULATORY_CARE_PROVIDER_SITE_OTHER): Payer: Federal, State, Local not specified - PPO | Admitting: Obstetrics and Gynecology

## 2022-09-05 ENCOUNTER — Encounter: Payer: Self-pay | Admitting: Obstetrics and Gynecology

## 2022-09-05 VITALS — BP 129/94 | HR 101 | Resp 16 | Ht 66.0 in | Wt 140.6 lb

## 2022-09-05 DIAGNOSIS — Z113 Encounter for screening for infections with a predominantly sexual mode of transmission: Secondary | ICD-10-CM

## 2022-09-05 DIAGNOSIS — N921 Excessive and frequent menstruation with irregular cycle: Secondary | ICD-10-CM

## 2022-09-05 DIAGNOSIS — N951 Menopausal and female climacteric states: Secondary | ICD-10-CM | POA: Diagnosis not present

## 2022-09-05 DIAGNOSIS — N888 Other specified noninflammatory disorders of cervix uteri: Secondary | ICD-10-CM

## 2022-09-08 ENCOUNTER — Other Ambulatory Visit: Payer: Self-pay

## 2022-09-08 ENCOUNTER — Telehealth: Payer: Self-pay

## 2022-09-08 DIAGNOSIS — M2011 Hallux valgus (acquired), right foot: Secondary | ICD-10-CM | POA: Diagnosis not present

## 2022-09-08 DIAGNOSIS — B009 Herpesviral infection, unspecified: Secondary | ICD-10-CM

## 2022-09-08 MED ORDER — VALACYCLOVIR HCL 1 G PO TABS
1000.0000 mg | ORAL_TABLET | Freq: Every day | ORAL | 12 refills | Status: DC
Start: 2022-09-08 — End: 2023-10-26

## 2022-09-08 NOTE — Telephone Encounter (Signed)
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

## 2022-09-09 ENCOUNTER — Ambulatory Visit (INDEPENDENT_AMBULATORY_CARE_PROVIDER_SITE_OTHER): Payer: Federal, State, Local not specified - PPO

## 2022-09-09 ENCOUNTER — Other Ambulatory Visit: Payer: Self-pay | Admitting: Obstetrics and Gynecology

## 2022-09-09 DIAGNOSIS — N921 Excessive and frequent menstruation with irregular cycle: Secondary | ICD-10-CM

## 2022-09-09 DIAGNOSIS — N888 Other specified noninflammatory disorders of cervix uteri: Secondary | ICD-10-CM

## 2022-09-09 DIAGNOSIS — N951 Menopausal and female climacteric states: Secondary | ICD-10-CM

## 2022-09-09 DIAGNOSIS — N939 Abnormal uterine and vaginal bleeding, unspecified: Secondary | ICD-10-CM

## 2022-09-09 DIAGNOSIS — Z113 Encounter for screening for infections with a predominantly sexual mode of transmission: Secondary | ICD-10-CM

## 2022-09-22 ENCOUNTER — Inpatient Hospital Stay: Payer: Federal, State, Local not specified - PPO | Attending: Internal Medicine

## 2022-09-23 ENCOUNTER — Encounter: Payer: Self-pay | Admitting: Oncology

## 2022-09-23 ENCOUNTER — Ambulatory Visit: Payer: Federal, State, Local not specified - PPO

## 2022-09-23 ENCOUNTER — Inpatient Hospital Stay: Payer: Federal, State, Local not specified - PPO

## 2022-09-23 ENCOUNTER — Inpatient Hospital Stay: Payer: Federal, State, Local not specified - PPO | Admitting: Oncology

## 2022-09-24 NOTE — Progress Notes (Deleted)
GYNECOLOGY PROGRESS NOTE  Subjective:    Patient ID: Nichole Gentry, female    DOB: 07-04-1975, 47 y.o.   MRN: [id number]  HPI  Patient is a 47 y.o. G81P0 female who presents for ultrasound follow up for abnormal uterine bleeding. Ultrasound was obtained on 08/06.  {Common ambulatory SmartLinks:19316}  Review of Systems {ros; complete:30496}   Objective:   Last menstrual period 08/24/2022. There is no height or weight on file to calculate BMI. General appearance: {general exam:16600} Abdomen: {abdominal exam:16834} Pelvic: {pelvic exam:16852::"cervix normal in appearance","external genitalia normal","no adnexal masses or tenderness","no cervical motion tenderness","rectovaginal septum normal","uterus normal size, shape, and consistency","vagina normal without discharge"} Extremities: {extremity exam:5109} Neurologic: {neuro exam:17854}  ULTRASOUND REPORT   Location: [facility] Date of Service: 09/09/2022      Indications:Abnormal Uterine Bleeding Findings:  The uterus is anteverted and measures 8.84 x 6.55 x 5.14 cm. Echo texture is homogenous without evidence of focal masses.   The Endometrium measures 4.82 mm. Thickened at fundal point, with vascular stalk leading too but not within endometrium    Right Ovary measures 2.82 x 2.30 x 1.90 cm. It is normal in appearance. Left Ovary measures 3.09 x 3.06 x 1.75 cm. It is normal in appearance. Simple cyst seen measuring; 2.74 x 2.49 x 2.32 cm Survey of the adnexa demonstrates no adnexal masses. There is no free fluid in the cul de sac.   Impression: Slightly thickened endometrium Lt simple ovarian cyst    Recommendations: 1.Clinical correlation with the patient's History and Physical Exam. 2. Follow up with provider   Assessment:   1. Metrorrhagia      Plan:   There are no diagnoses linked to this encounter.     Hildred Laser, MD [facility]

## 2022-09-26 ENCOUNTER — Ambulatory Visit: Payer: Federal, State, Local not specified - PPO | Admitting: Obstetrics and Gynecology

## 2022-09-26 DIAGNOSIS — N921 Excessive and frequent menstruation with irregular cycle: Secondary | ICD-10-CM

## 2022-10-02 ENCOUNTER — Ambulatory Visit (INDEPENDENT_AMBULATORY_CARE_PROVIDER_SITE_OTHER): Payer: Federal, State, Local not specified - PPO | Admitting: Obstetrics and Gynecology

## 2022-10-02 ENCOUNTER — Encounter: Payer: Self-pay | Admitting: Obstetrics and Gynecology

## 2022-10-02 VITALS — BP 102/69 | HR 73 | Resp 16 | Ht 66.0 in | Wt 147.0 lb

## 2022-10-02 DIAGNOSIS — N83202 Unspecified ovarian cyst, left side: Secondary | ICD-10-CM | POA: Diagnosis not present

## 2022-10-02 DIAGNOSIS — N924 Excessive bleeding in the premenopausal period: Secondary | ICD-10-CM | POA: Diagnosis not present

## 2022-10-02 MED ORDER — NORETHINDRONE 0.35 MG PO TABS: 1.0000 | ORAL_TABLET | Freq: Every day | ORAL | 3 refills | Status: DC

## 2022-10-02 NOTE — Progress Notes (Signed)
    GYNECOLOGY PROGRESS NOTE  Subjective:    Patient ID: Nichole Gentry, female    DOB: April 28, 1975, 47 y.o.   MRN: [id number]  HPI  Patient is a 47 y.o. G54P0 female who presents for follow up from ultrasound. She had an ultrasound done on 09/09/22 for likely perimenopausal abnormal uterine bleeding. Patient notes that this past month she only had 1 cycle, however lasted for ~ 10 days (previously has been having 2 cycles per month for the past few months).  The following portions of the patient's history were reviewed and updated as appropriate: allergies, current medications, past family history, past medical history, past social history, past surgical history, and problem list.  Review of Systems Pertinent items noted in HPI and remainder of comprehensive ROS otherwise negative.   Objective:   Blood pressure 102/69, pulse 73, resp. rate 16, height 5\' 6"  (1.676 m), weight 147 lb (66.7 kg), last menstrual period 08/24/2022.  Body mass index is 23.73 kg/m. General appearance: alert, cooperative, and no distress Remainder of exam deferred   Ultrasound:  Location: [facility] Date of Service: 09/09/2022     Indications:Abnormal Uterine Bleeding Findings:  The uterus is anteverted and measures 8.84 x 6.55 x 5.14 cm. Echo texture is homogenous without evidence of focal masses.     The Endometrium measures 4.82 mm. Thickened at fundal point, with vascular stalk leading too but not within endometrium    Right Ovary measures 2.82 x 2.30 x 1.90 cm. It is normal in appearance. Left Ovary measures 3.09 x 3.06 x 1.75 cm. It is normal in appearance. Simple cyst seen measuring; 2.74 x 2.49 x 2.32 cm Survey of the adnexa demonstrates no adnexal masses. There is no free fluid in the cul de sac.   Impression: Slightly thickened endometrium Lt simple ovarian cyst     Assessment:   1. Abnormal perimenopausal bleeding   2. Left ovarian cyst      Plan:  1. Abnormal  perimenopausal bleeding - Discussed management options for abnormal uterine bleeding including  oral progesterone, Depo Provera, Levonogestrel IUD, endometrial ablation or hysterectomy as definitive surgical management.  Discussed risks and benefits of each method.   Patient desires to try oral progesterone.  Has tried TXA for short time with partial relief.  Given sample of Slynd for now as patient notes that she would still like to see her cycle each month.  Prescription for Micronor sent based on patietn's insurance coverage. Encouraged on Sunday start after next episode of bleeding. Bleeding precautions reviewed.  If no improvement in symptoms, will proceed with further evaluation of endometrial biopsy.  - RTC in 3 months to f/u bleeding.   2. Left ovarian cyst - Small simple cyst on left, advised that no intervention or further surveillance indicated at this time.    A total of 15 minutes were spent face-to-face with the patient during this encounter and over half of that time dealt with counseling and coordination of care.   Hildred Laser, MD [facility]

## 2022-10-29 DIAGNOSIS — F909 Attention-deficit hyperactivity disorder, unspecified type: Secondary | ICD-10-CM | POA: Diagnosis not present

## 2022-10-29 DIAGNOSIS — F411 Generalized anxiety disorder: Secondary | ICD-10-CM | POA: Diagnosis not present

## 2022-11-12 DIAGNOSIS — M47812 Spondylosis without myelopathy or radiculopathy, cervical region: Secondary | ICD-10-CM | POA: Diagnosis not present

## 2022-11-28 ENCOUNTER — Other Ambulatory Visit: Payer: Self-pay

## 2022-11-28 ENCOUNTER — Inpatient Hospital Stay: Payer: Federal, State, Local not specified - PPO | Attending: Internal Medicine

## 2022-11-28 DIAGNOSIS — K219 Gastro-esophageal reflux disease without esophagitis: Secondary | ICD-10-CM | POA: Insufficient documentation

## 2022-11-28 DIAGNOSIS — E538 Deficiency of other specified B group vitamins: Secondary | ICD-10-CM | POA: Diagnosis not present

## 2022-11-28 DIAGNOSIS — Z79624 Long term (current) use of inhibitors of nucleotide synthesis: Secondary | ICD-10-CM | POA: Insufficient documentation

## 2022-11-28 DIAGNOSIS — Z23 Encounter for immunization: Secondary | ICD-10-CM | POA: Diagnosis not present

## 2022-11-28 DIAGNOSIS — Z79899 Other long term (current) drug therapy: Secondary | ICD-10-CM | POA: Diagnosis not present

## 2022-11-28 DIAGNOSIS — Z801 Family history of malignant neoplasm of trachea, bronchus and lung: Secondary | ICD-10-CM | POA: Insufficient documentation

## 2022-11-28 DIAGNOSIS — D509 Iron deficiency anemia, unspecified: Secondary | ICD-10-CM | POA: Insufficient documentation

## 2022-11-28 DIAGNOSIS — D508 Other iron deficiency anemias: Secondary | ICD-10-CM

## 2022-11-28 DIAGNOSIS — Z808 Family history of malignant neoplasm of other organs or systems: Secondary | ICD-10-CM | POA: Insufficient documentation

## 2022-11-28 DIAGNOSIS — Z8616 Personal history of COVID-19: Secondary | ICD-10-CM | POA: Insufficient documentation

## 2022-11-28 DIAGNOSIS — R61 Generalized hyperhidrosis: Secondary | ICD-10-CM | POA: Insufficient documentation

## 2022-11-28 DIAGNOSIS — Z9884 Bariatric surgery status: Secondary | ICD-10-CM | POA: Insufficient documentation

## 2022-11-28 LAB — CBC WITH DIFFERENTIAL (CANCER CENTER ONLY)
Abs Immature Granulocytes: 0.02 10*3/uL (ref 0.00–0.07)
Basophils Absolute: 0 10*3/uL (ref 0.0–0.1)
Basophils Relative: 1 %
Eosinophils Absolute: 0.1 10*3/uL (ref 0.0–0.5)
Eosinophils Relative: 2 %
HCT: 37.8 % (ref 36.0–46.0)
Hemoglobin: 12.9 g/dL (ref 12.0–15.0)
Immature Granulocytes: 0 %
Lymphocytes Relative: 29 %
Lymphs Abs: 1.7 10*3/uL (ref 0.7–4.0)
MCH: 31.5 pg (ref 26.0–34.0)
MCHC: 34.1 g/dL (ref 30.0–36.0)
MCV: 92.2 fL (ref 80.0–100.0)
Monocytes Absolute: 0.4 10*3/uL (ref 0.1–1.0)
Monocytes Relative: 7 %
Neutro Abs: 3.6 10*3/uL (ref 1.7–7.7)
Neutrophils Relative %: 61 %
Platelet Count: 190 10*3/uL (ref 150–400)
RBC: 4.1 MIL/uL (ref 3.87–5.11)
RDW: 11.5 % (ref 11.5–15.5)
WBC Count: 5.9 10*3/uL (ref 4.0–10.5)
nRBC: 0 % (ref 0.0–0.2)

## 2022-11-28 LAB — CMP (CANCER CENTER ONLY)
ALT: 14 U/L (ref 0–44)
AST: 18 U/L (ref 15–41)
Albumin: 3.7 g/dL (ref 3.5–5.0)
Alkaline Phosphatase: 70 U/L (ref 38–126)
Anion gap: 6 (ref 5–15)
BUN: 15 mg/dL (ref 6–20)
CO2: 26 mmol/L (ref 22–32)
Calcium: 8.8 mg/dL — ABNORMAL LOW (ref 8.9–10.3)
Chloride: 104 mmol/L (ref 98–111)
Creatinine: 0.82 mg/dL (ref 0.44–1.00)
GFR, Estimated: >60 mL/min (ref >60)
Glucose, Bld: 95 mg/dL (ref 70–99)
Potassium: 3.4 mmol/L — ABNORMAL LOW (ref 3.5–5.1)
Sodium: 136 mmol/L (ref 135–145)
Total Bilirubin: 0.6 mg/dL (ref 0.3–1.2)
Total Protein: 6.3 g/dL — ABNORMAL LOW (ref 6.5–8.1)

## 2022-11-28 LAB — FERRITIN: Ferritin: 49 ng/mL (ref 11–307)

## 2022-11-28 LAB — VITAMIN B12: Vitamin B-12: 240 pg/mL (ref 180–914)

## 2022-11-28 LAB — IRON AND TIBC
Iron: 73 ug/dL (ref 28–170)
Saturation Ratios: 21 % (ref 10.4–31.8)
TIBC: 351 ug/dL (ref 250–450)
UIBC: 278 ug/dL

## 2022-11-28 LAB — FOLATE: Folate: 10.4 ng/mL (ref >5.9)

## 2022-12-02 ENCOUNTER — Inpatient Hospital Stay: Payer: Federal, State, Local not specified - PPO | Admitting: Oncology

## 2022-12-02 ENCOUNTER — Inpatient Hospital Stay: Payer: Federal, State, Local not specified - PPO

## 2022-12-02 ENCOUNTER — Encounter: Payer: Self-pay | Admitting: Oncology

## 2022-12-02 VITALS — BP 108/81 | HR 91 | Temp 97.0°F | Resp 18

## 2022-12-02 VITALS — BP 109/86 | HR 105 | Temp 97.1°F | Resp 18 | Ht 66.0 in | Wt 135.5 lb

## 2022-12-02 DIAGNOSIS — Z8616 Personal history of COVID-19: Secondary | ICD-10-CM | POA: Diagnosis not present

## 2022-12-02 DIAGNOSIS — R61 Generalized hyperhidrosis: Secondary | ICD-10-CM

## 2022-12-02 DIAGNOSIS — Z808 Family history of malignant neoplasm of other organs or systems: Secondary | ICD-10-CM | POA: Diagnosis not present

## 2022-12-02 DIAGNOSIS — Z801 Family history of malignant neoplasm of trachea, bronchus and lung: Secondary | ICD-10-CM | POA: Diagnosis not present

## 2022-12-02 DIAGNOSIS — Z79624 Long term (current) use of inhibitors of nucleotide synthesis: Secondary | ICD-10-CM | POA: Diagnosis not present

## 2022-12-02 DIAGNOSIS — Z79899 Other long term (current) drug therapy: Secondary | ICD-10-CM | POA: Diagnosis not present

## 2022-12-02 DIAGNOSIS — Z23 Encounter for immunization: Secondary | ICD-10-CM

## 2022-12-02 DIAGNOSIS — Z9884 Bariatric surgery status: Secondary | ICD-10-CM | POA: Diagnosis not present

## 2022-12-02 DIAGNOSIS — E538 Deficiency of other specified B group vitamins: Secondary | ICD-10-CM | POA: Diagnosis not present

## 2022-12-02 DIAGNOSIS — K9589 Other complications of other bariatric procedure: Secondary | ICD-10-CM | POA: Diagnosis not present

## 2022-12-02 DIAGNOSIS — D508 Other iron deficiency anemias: Secondary | ICD-10-CM

## 2022-12-02 DIAGNOSIS — D509 Iron deficiency anemia, unspecified: Secondary | ICD-10-CM | POA: Diagnosis not present

## 2022-12-02 DIAGNOSIS — K219 Gastro-esophageal reflux disease without esophagitis: Secondary | ICD-10-CM | POA: Diagnosis not present

## 2022-12-02 MED ORDER — SODIUM CHLORIDE 0.9% FLUSH
10.0000 mL | Freq: Once | INTRAVENOUS | Status: DC | PRN
Start: 2022-12-02 — End: 2022-12-02
  Filled 2022-12-02: qty 10

## 2022-12-02 MED ORDER — IRON SUCROSE 20 MG/ML IV SOLN
200.0000 mg | Freq: Once | INTRAVENOUS | Status: AC
  Administered 2022-12-02: 200 mg via INTRAVENOUS
  Filled 2022-12-02: qty 10

## 2022-12-02 MED ORDER — INFLUENZA VIRUS VACC SPLIT PF (FLUZONE) 0.5 ML IM SUSY
0.5000 mL | PREFILLED_SYRINGE | Freq: Once | INTRAMUSCULAR | Status: AC
  Administered 2022-12-02: 0.5 mL via INTRAMUSCULAR

## 2022-12-02 MED ORDER — CYANOCOBALAMIN 1000 MCG/ML IJ SOLN
1000.0000 ug | Freq: Once | INTRAMUSCULAR | Status: AC
Start: 2022-12-02 — End: 2022-12-02
  Administered 2022-12-02: 1000 ug via INTRAMUSCULAR
  Filled 2022-12-02: qty 1

## 2022-12-02 NOTE — Progress Notes (Signed)
Pt here for follow up. Pt reports that she has been having frequent night drenches, 2-3 night a week within the last couple of weeks.

## 2022-12-02 NOTE — Assessment & Plan Note (Addendum)
B12 level is at low normal end.  continue monthly parenteral vitamin B12 injection 1000 MCG

## 2022-12-02 NOTE — Assessment & Plan Note (Signed)
Exacerbated chronic night sweats. ?  Hormonal versus due to underlying malignancy She is not able to provide precise timeframe of onset of exacerbation, likely over the past couple of weeks to 2 months. ?  Secondary to COVID 19 infection, start of progesterone. She has always had fluctuating weight. Recommend patient to further discuss with gynecology to see if suggested" potentially be causing the sweats. Discussed with patient about red flags including significant unintentional weight loss.  If she experiences that, she needs to come in office and we will initiate imaging study for workup of malignancy.

## 2022-12-02 NOTE — Progress Notes (Signed)
Hematology/Oncology Progress note [phone] [phone]     Chief Complaint: Nichole Gentry is a 47 y.o. female s/p gastric bypass surgery presents for iron deficiency and Vitamin B12 deficiency.  ASSESSMENT & PLAN:   Iron deficiency anemia following bariatric surgery #Iron deficiency anemia in the context of gastric bypass Lab results are available after her visit.  Lab Results  Component Value Date   HGB 12.9 11/28/2022   TIBC 351 11/28/2022   IRONPCTSAT 21 11/28/2022   FERRITIN 49 11/28/2022    IV venofer 200mg  x 1   B12 deficiency B12 level is at low normal end.  continue monthly parenteral vitamin B12 injection 1000 MCG  Night sweat Exacerbated chronic night sweats. ?  Hormonal versus due to underlying malignancy She is not able to provide precise timeframe of onset of exacerbation, likely over the past couple of weeks to 2 months. ?  Secondary to COVID 19 infection, start of progesterone. She has always had fluctuating weight. Recommend patient to further discuss with gynecology to see if suggested" potentially be causing the sweats. Discussed with patient about red flags including significant unintentional weight loss.  If she experiences that, she needs to come in office and we will initiate imaging study for workup of malignancy.     Orders Placed This Encounter  Procedures   CMP (Cancer Center only)    Standing Status:   Future    Standing Expiration Date:   12/02/2023   CBC with Differential (Cancer Center Only)    Standing Status:   Future    Standing Expiration Date:   12/02/2023   Iron and TIBC    Standing Status:   Future    Standing Expiration Date:   12/02/2023   Ferritin    Standing Status:   Future    Standing Expiration Date:   12/02/2023   Vitamin B12    Standing Status:   Future    Standing Expiration Date:   12/02/2023   Follow up in 6 months.  All questions were answered. The patient knows to call the clinic with  any problems, questions or concerns.  Rickard Patience, MD, PhD [facility] Hematology Oncology 12/02/2022   PERTINENT HEMATOLOGY HISTORY   # s/p gastric bypass surgery with iron deficiency anemia.  She may have celiac disease.  She is intolerant of oral iron.  EGD in 2019 revealed a hiatal hernia, mildly dilated esophagus and dysmotility esophagus.  There were fundic gland polyps.  Duodenal biopsies revealed some increase in epithelial lymphocytes.  Colonoscopy on 04/22/2019 revealed only hemorrhoids.  She is on a gluten-free diet.  EGD on 11/07/2019 revealed salmon-colored mucosa suspicious for short-segment Barrett's esophagus. There was a small hiatal hernia. Roux-en-Y gastrojejunostomy with gastrojejunal anastomosis characterized by healthy appearing mucosa. Stomach and esophagus biopsies showed chronic inflammation. There was no intestinal metaplasia, dysplasia, and malignancy.  Patient reports feeling extremely tired today.  She missed her appointment for vitamin B12 injection last months.  She had close contact with a friend who was recently diagnosed with strep.  Patient reports some scratchy sore throat and swelling of the left neck.  No fever, chills, she took and COVID testing at home and was negative.   INTERVAL HISTORY Nichole Gentry is a 47 y.o. female who has above history reviewed by me today presents for follow up visit for Iron deficiency anemia and B12 deficiency.   She has missed her August/September B12 injections.  Recently in September 2024, she was infected with COVID-19.  She also  follows with gynecology and was recently started on progesterone for menorrhagia.  She started progesterone around 10/22/2022.  Patient mentioned that recently she has experienced drenching night sweats 2-3 times per week.  She used to have chronic night sweats occasionally, once a month.  She is not able to provide a precise timeframe of onset of night sweats exacerbation symptoms.  Probably for the past  couple weeks to couple of months.  09/23/2022, estradiol level 116, FSH 5.8.  She is premenopausal   Past Medical History:  Diagnosis Date   ADD (attention deficit disorder)    Anemia    B12 deficiency    GERD (gastroesophageal reflux disease)    Low iron    Migraine    1-2x/month   Simple febrile convulsions (HCC)    as infant    Past Surgical History:  Procedure Laterality Date   BUNIONECTOMY Right 06/18/2022   Procedure: BUNIONECTOMY LAPIDUS TYPE;  Surgeon: Gwyneth Revels, DPM;  Location: [facility];  Service: Podiatry;  Laterality: Right;   ESOPHAGOGASTRODUODENOSCOPY (EGD) WITH PROPOFOL N/A 11/07/2019   Procedure: ESOPHAGOGASTRODUODENOSCOPY (EGD) WITH PROPOFOL;  Surgeon: Regis Bill, MD;  Location: [facility];  Service: Endoscopy;  Laterality: N/A;   HIATAL HERNIA REPAIR     LAPAROSCOPIC REVISION OF ROUXENY WITH UPPER ENDOSCOPY     linx management system      Family History  Problem Relation Age of Onset   Thyroid disease Mother    Diabetes Mother    Hyperlipidemia Father    Lung cancer Father    Brain cancer Father     Social History:  reports that she has never smoked. She has never used smokeless tobacco. She reports current alcohol use. She reports that she does not use drugs. The patient is alone today.  Allergies: No Known Allergies  Current Medications: Current Outpatient Medications  Medication Sig Dispense Refill   amphetamine-dextroamphetamine (ADDERALL) 10 MG tablet Take 10 mg by mouth daily as needed.     Butalbital-APAP-Caffeine 50-325-40 MG capsule Take by mouth as needed for headache.     cyanocobalamin (,VITAMIN B-12,) 1000 MCG/ML injection Inject 1 mL (1,000 mcg total) into the muscle every 30 (thirty) days. Administer weekly x 3 and then monthly. 1 mL 4   LORazepam (ATIVAN) 0.5 MG tablet Take 0.5 mg by mouth 2 (two) times daily as needed.     norethindrone (ORTHO MICRONOR) 0.35 MG tablet Take 1 tablet (0.35 mg total) by mouth  daily. 84 tablet 3   pantoprazole (PROTONIX) 40 MG tablet Take 40 mg by mouth daily.     pregabalin (LYRICA) 75 MG capsule Take 1 capsule (75 mg total) by mouth 2 (two) times daily. (Patient taking differently: Take 75 mg by mouth 2 (two) times daily as needed.) 60 capsule 1   SYRINGE-NEEDLE, DISP, 3 ML (BD SAFETYGLIDE SYRINGE/NEEDLE) 25G X 1" 3 ML MISC Use the needle for b12 injection once a month. 5 each 0   valACYclovir (VALTREX) 1000 MG tablet Take 1 tablet (1,000 mg total) by mouth daily. 30 tablet 12   VYVANSE 70 MG capsule TAKE 1 CAPSULE BY MOUTH IN THE MORNING ONCE DAILY FOR 30 DAYS  0   zolpidem (AMBIEN) 10 MG tablet Take 10 mg by mouth at bedtime as needed.     No current facility-administered medications for this visit.   Facility-Administered Medications Ordered in Other Visits  Medication Dose Route Frequency Provider Last Rate Last Admin   sodium chloride flush (NS) 0.9 % injection 10 mL  10 mL Intracatheter Once PRN Rushie Chestnut, PA-C         Review of Systems  Constitutional:  Positive for fatigue. Negative for appetite change, chills and fever.  HENT:   Negative for hearing loss and voice change.   Eyes:  Negative for eye problems.  Respiratory:  Negative for chest tightness and cough.   Cardiovascular:  Negative for chest pain.  Gastrointestinal:  Negative for abdominal distention, abdominal pain and blood in stool.  Endocrine: Negative for hot flashes.       Night sweats  Genitourinary:  Negative for difficulty urinating and frequency.   Musculoskeletal:  Negative for arthralgias.  Skin:  Negative for itching and rash.  Neurological:  Negative for extremity weakness.  Hematological:  Negative for adenopathy.  Psychiatric/Behavioral:  Negative for confusion.       Vitals Blood pressure 109/86, pulse (!) 105, temperature (!) 97.1 F (36.2 C), resp. rate 18, height 5\' 6"  (1.676 m), weight 135 lb 8 oz (61.5 kg), SpO2 99%.  Performance status (ECOG):  0  Physical Exam Constitutional:      General: She is not in acute distress.    Appearance: She is not diaphoretic.  HENT:     Head: Normocephalic and atraumatic.  Eyes:     General: No scleral icterus. Cardiovascular:     Rate and Rhythm: Normal rate and regular rhythm.  Pulmonary:     Effort: Pulmonary effort is normal. No respiratory distress.  Abdominal:     General: Bowel sounds are normal. There is no distension.     Palpations: Abdomen is soft.  Musculoskeletal:        General: Normal range of motion.     Cervical back: Normal range of motion and neck supple.  Lymphadenopathy:     Cervical: No cervical adenopathy.  Skin:    General: Skin is warm.     Findings: No erythema.  Neurological:     Mental Status: She is alert and oriented to person, place, and time. Mental status is at baseline.     Cranial Nerves: No cranial nerve deficit.     Motor: No abnormal muscle tone.  Psychiatric:        Mood and Affect: Affect normal.      Labs are reviewed     Latest Ref Rng & Units 11/28/2022   10:16 AM 03/24/2022   10:39 AM 09/18/2021   11:15 AM  CBC  WBC 4.0 - 10.5 K/uL 5.9  4.6  5.2   Hemoglobin 12.0 - 15.0 g/dL 16.1  09.6  04.5   Hematocrit 36.0 - 46.0 % 37.8  41.2  42.9   Platelets 150 - 400 K/uL 190  238  287       Latest Ref Rng & Units 11/28/2022   10:17 AM 08/29/2020    8:56 AM 06/30/2017   12:27 PM  CMP  Glucose 70 - 99 mg/dL 95  409  811   BUN 6 - 20 mg/dL 15  19  15    Creatinine 0.44 - 1.00 mg/dL 9.14  7.82  9.56   Sodium 135 - 145 mmol/L 136  136  138   Potassium 3.5 - 5.1 mmol/L 3.4  3.7  3.2   Chloride 98 - 111 mmol/L 104  103  105   CO2 22 - 32 mmol/L 26  27  23    Calcium 8.9 - 10.3 mg/dL 8.8  8.4  9.0   Total Protein 6.5 - 8.1 g/dL 6.3  6.9  7.6   Total Bilirubin 0.3 - 1.2 mg/dL 0.6  0.7  0.9   Alkaline Phos 38 - 126 U/L 70  62  65   AST 15 - 41 U/L 18  18  19    ALT 0 - 44 U/L 14  15  11

## 2022-12-02 NOTE — Assessment & Plan Note (Addendum)
#  Iron deficiency anemia in the context of gastric bypass Lab results are available after her visit.  Lab Results  Component Value Date   HGB 12.9 11/28/2022   TIBC 351 11/28/2022   IRONPCTSAT 21 11/28/2022   FERRITIN 49 11/28/2022    IV venofer 200mg  x 1

## 2022-12-26 ENCOUNTER — Other Ambulatory Visit: Payer: Self-pay | Admitting: Internal Medicine

## 2023-01-05 ENCOUNTER — Inpatient Hospital Stay: Payer: Federal, State, Local not specified - PPO | Attending: Internal Medicine

## 2023-01-05 DIAGNOSIS — Z79899 Other long term (current) drug therapy: Secondary | ICD-10-CM | POA: Insufficient documentation

## 2023-01-05 DIAGNOSIS — E538 Deficiency of other specified B group vitamins: Secondary | ICD-10-CM | POA: Insufficient documentation

## 2023-01-12 ENCOUNTER — Inpatient Hospital Stay: Payer: Federal, State, Local not specified - PPO

## 2023-01-12 DIAGNOSIS — D508 Other iron deficiency anemias: Secondary | ICD-10-CM

## 2023-01-12 DIAGNOSIS — E538 Deficiency of other specified B group vitamins: Secondary | ICD-10-CM | POA: Diagnosis not present

## 2023-01-12 DIAGNOSIS — Z79899 Other long term (current) drug therapy: Secondary | ICD-10-CM | POA: Diagnosis not present

## 2023-01-12 MED ORDER — CYANOCOBALAMIN 1000 MCG/ML IJ SOLN
1000.0000 ug | Freq: Once | INTRAMUSCULAR | Status: AC
  Administered 2023-01-12: 1000 ug via INTRAMUSCULAR
  Filled 2023-01-12: qty 1

## 2023-01-19 ENCOUNTER — Inpatient Hospital Stay: Payer: Federal, State, Local not specified - PPO

## 2023-01-26 ENCOUNTER — Inpatient Hospital Stay: Payer: Federal, State, Local not specified - PPO

## 2023-02-02 ENCOUNTER — Inpatient Hospital Stay: Payer: Federal, State, Local not specified - PPO

## 2023-02-12 ENCOUNTER — Inpatient Hospital Stay: Payer: Federal, State, Local not specified - PPO | Attending: Internal Medicine

## 2023-03-16 ENCOUNTER — Inpatient Hospital Stay: Payer: Federal, State, Local not specified - PPO | Attending: Internal Medicine

## 2023-03-23 DIAGNOSIS — F411 Generalized anxiety disorder: Secondary | ICD-10-CM | POA: Diagnosis not present

## 2023-03-23 DIAGNOSIS — F909 Attention-deficit hyperactivity disorder, unspecified type: Secondary | ICD-10-CM | POA: Diagnosis not present

## 2023-04-13 ENCOUNTER — Inpatient Hospital Stay: Payer: Federal, State, Local not specified - PPO | Attending: Internal Medicine

## 2023-05-14 ENCOUNTER — Telehealth: Payer: Self-pay | Admitting: Oncology

## 2023-05-14 NOTE — Telephone Encounter (Signed)
 Patient called and she says she missed her B12 she wants to know if she should come for b12 now and then have labs/see MD as scheduled?  Please advise how to schedule

## 2023-05-14 NOTE — Telephone Encounter (Signed)
 Keep appt as scheduled. B12 levels will be rechecked and she is scheduled to receive it when she see's Dr. Cathie Hoops, if needed. Thanks

## 2023-05-29 ENCOUNTER — Inpatient Hospital Stay: Payer: Federal, State, Local not specified - PPO | Attending: Internal Medicine

## 2023-05-29 DIAGNOSIS — Z79899 Other long term (current) drug therapy: Secondary | ICD-10-CM | POA: Insufficient documentation

## 2023-05-29 DIAGNOSIS — Z79624 Long term (current) use of inhibitors of nucleotide synthesis: Secondary | ICD-10-CM | POA: Insufficient documentation

## 2023-05-29 DIAGNOSIS — D509 Iron deficiency anemia, unspecified: Secondary | ICD-10-CM | POA: Insufficient documentation

## 2023-05-29 DIAGNOSIS — E538 Deficiency of other specified B group vitamins: Secondary | ICD-10-CM | POA: Insufficient documentation

## 2023-05-29 DIAGNOSIS — M545 Low back pain, unspecified: Secondary | ICD-10-CM | POA: Diagnosis not present

## 2023-05-29 DIAGNOSIS — Z801 Family history of malignant neoplasm of trachea, bronchus and lung: Secondary | ICD-10-CM | POA: Diagnosis not present

## 2023-05-29 DIAGNOSIS — K219 Gastro-esophageal reflux disease without esophagitis: Secondary | ICD-10-CM | POA: Diagnosis not present

## 2023-05-29 DIAGNOSIS — Z9884 Bariatric surgery status: Secondary | ICD-10-CM | POA: Diagnosis not present

## 2023-05-29 DIAGNOSIS — K9589 Other complications of other bariatric procedure: Secondary | ICD-10-CM

## 2023-05-29 DIAGNOSIS — R5383 Other fatigue: Secondary | ICD-10-CM | POA: Diagnosis not present

## 2023-05-29 LAB — CMP (CANCER CENTER ONLY)
ALT: 12 U/L (ref 0–44)
AST: 19 U/L (ref 15–41)
Albumin: 3.9 g/dL (ref 3.5–5.0)
Alkaline Phosphatase: 63 U/L (ref 38–126)
Anion gap: 7 (ref 5–15)
BUN: 14 mg/dL (ref 6–20)
CO2: 24 mmol/L (ref 22–32)
Calcium: 8.6 mg/dL — ABNORMAL LOW (ref 8.9–10.3)
Chloride: 104 mmol/L (ref 98–111)
Creatinine: 0.61 mg/dL (ref 0.44–1.00)
GFR, Estimated: >60 mL/min (ref >60)
Glucose, Bld: 107 mg/dL — ABNORMAL HIGH (ref 70–99)
Potassium: 3.8 mmol/L (ref 3.5–5.1)
Sodium: 135 mmol/L (ref 135–145)
Total Bilirubin: 0.6 mg/dL (ref 0.0–1.2)
Total Protein: 6.7 g/dL (ref 6.5–8.1)

## 2023-05-29 LAB — CBC WITH DIFFERENTIAL (CANCER CENTER ONLY)
Abs Immature Granulocytes: 0.01 10*3/uL (ref 0.00–0.07)
Basophils Absolute: 0 10*3/uL (ref 0.0–0.1)
Basophils Relative: 1 %
Eosinophils Absolute: 0 10*3/uL (ref 0.0–0.5)
Eosinophils Relative: 1 %
HCT: 39.5 % (ref 36.0–46.0)
Hemoglobin: 13.7 g/dL (ref 12.0–15.0)
Immature Granulocytes: 0 %
Lymphocytes Relative: 35 %
Lymphs Abs: 1.5 10*3/uL (ref 0.7–4.0)
MCH: 31.9 pg (ref 26.0–34.0)
MCHC: 34.7 g/dL (ref 30.0–36.0)
MCV: 91.9 fL (ref 80.0–100.0)
Monocytes Absolute: 0.4 10*3/uL (ref 0.1–1.0)
Monocytes Relative: 8 %
Neutro Abs: 2.5 10*3/uL (ref 1.7–7.7)
Neutrophils Relative %: 55 %
Platelet Count: 217 10*3/uL (ref 150–400)
RBC: 4.3 MIL/uL (ref 3.87–5.11)
RDW: 11.8 % (ref 11.5–15.5)
WBC Count: 4.5 10*3/uL (ref 4.0–10.5)
nRBC: 0 % (ref 0.0–0.2)

## 2023-05-29 LAB — IRON AND TIBC
Iron: 138 ug/dL (ref 28–170)
Saturation Ratios: 37 % — ABNORMAL HIGH (ref 10.4–31.8)
TIBC: 372 ug/dL (ref 250–450)
UIBC: 234 ug/dL

## 2023-05-29 LAB — FERRITIN: Ferritin: 39 ng/mL (ref 11–307)

## 2023-05-29 LAB — VITAMIN B12: Vitamin B-12: 213 pg/mL (ref 180–914)

## 2023-06-02 ENCOUNTER — Encounter: Payer: Self-pay | Admitting: Oncology

## 2023-06-02 ENCOUNTER — Inpatient Hospital Stay: Payer: Federal, State, Local not specified - PPO | Admitting: Oncology

## 2023-06-02 ENCOUNTER — Inpatient Hospital Stay: Payer: Federal, State, Local not specified - PPO

## 2023-06-02 VITALS — BP 122/82 | HR 97 | Temp 96.8°F | Resp 16 | Wt 142.0 lb

## 2023-06-02 DIAGNOSIS — D508 Other iron deficiency anemias: Secondary | ICD-10-CM

## 2023-06-02 DIAGNOSIS — K9589 Other complications of other bariatric procedure: Secondary | ICD-10-CM | POA: Diagnosis not present

## 2023-06-02 DIAGNOSIS — E538 Deficiency of other specified B group vitamins: Secondary | ICD-10-CM | POA: Diagnosis not present

## 2023-06-02 DIAGNOSIS — Z801 Family history of malignant neoplasm of trachea, bronchus and lung: Secondary | ICD-10-CM | POA: Diagnosis not present

## 2023-06-02 DIAGNOSIS — Z9884 Bariatric surgery status: Secondary | ICD-10-CM | POA: Diagnosis not present

## 2023-06-02 DIAGNOSIS — Z79899 Other long term (current) drug therapy: Secondary | ICD-10-CM | POA: Diagnosis not present

## 2023-06-02 DIAGNOSIS — K219 Gastro-esophageal reflux disease without esophagitis: Secondary | ICD-10-CM | POA: Diagnosis not present

## 2023-06-02 DIAGNOSIS — M545 Low back pain, unspecified: Secondary | ICD-10-CM | POA: Diagnosis not present

## 2023-06-02 DIAGNOSIS — R5383 Other fatigue: Secondary | ICD-10-CM | POA: Diagnosis not present

## 2023-06-02 DIAGNOSIS — D509 Iron deficiency anemia, unspecified: Secondary | ICD-10-CM | POA: Diagnosis not present

## 2023-06-02 DIAGNOSIS — Z79624 Long term (current) use of inhibitors of nucleotide synthesis: Secondary | ICD-10-CM | POA: Diagnosis not present

## 2023-06-02 MED ORDER — CYANOCOBALAMIN 1000 MCG/ML IJ SOLN
1000.0000 ug | Freq: Once | INTRAMUSCULAR | Status: AC
  Administered 2023-06-02: 1000 ug via INTRAMUSCULAR
  Filled 2023-06-02: qty 1

## 2023-06-02 NOTE — Assessment & Plan Note (Signed)
#  Iron  deficiency anemia in the context of gastric bypass Lab results are available after her visit.  Lab Results  Component Value Date   HGB 13.7 05/29/2023   TIBC 372 05/29/2023   IRONPCTSAT 37 (H) 05/29/2023   FERRITIN 39 05/29/2023    Hold additional Venofer .

## 2023-06-02 NOTE — Progress Notes (Signed)
 Hematology/Oncology Progress note [phone] [phone]     Chief Complaint: Nichole Gentry is a 48 y.o. female s/p gastric bypass surgery presents for iron  deficiency and Vitamin B12 deficiency.  ASSESSMENT & PLAN:   Iron  deficiency anemia following bariatric surgery #Iron  deficiency anemia in the context of gastric bypass Lab results are available after her visit.  Lab Results  Component Value Date   HGB 13.7 05/29/2023   TIBC 372 05/29/2023   IRONPCTSAT 37 (H) 05/29/2023   FERRITIN 39 05/29/2023    Hold additional Venofer .   B12 deficiency B12 level is at low normal end.  continue monthly parenteral vitamin B12 injection 1000 MCG   Orders Placed This Encounter  Procedures   CBC with Differential (Cancer Center Only)    Standing Status:   Future    Expected Date:   12/02/2023    Expiration Date:   06/01/2024   Iron  and TIBC    Standing Status:   Future    Expected Date:   12/02/2023    Expiration Date:   06/01/2024   Ferritin    Standing Status:   Future    Expected Date:   12/02/2023    Expiration Date:   06/01/2024   Vitamin B12    Standing Status:   Future    Expected Date:   12/02/2023    Expiration Date:   06/01/2024   Follow up in 6 months.  All questions were answered. The patient knows to call the clinic with any problems, questions or concerns.  Nichole Forbes, MD, PhD [facility] Hematology Oncology 06/02/2023   PERTINENT HEMATOLOGY HISTORY   # s/p gastric bypass surgery with iron  deficiency anemia.  She may have celiac disease.  She is intolerant of oral iron .  EGD in 2019 revealed a hiatal hernia, mildly dilated esophagus and dysmotility esophagus.  There were fundic gland polyps.  Duodenal biopsies revealed some increase in epithelial lymphocytes.  Colonoscopy on 04/22/2019 revealed only hemorrhoids.  She is on a gluten-free diet.  EGD on 11/07/2019 revealed salmon-colored mucosa suspicious for short-segment Barrett's esophagus. There  was a small hiatal hernia. Roux-en-Y gastrojejunostomy with gastrojejunal anastomosis characterized by healthy appearing mucosa. Stomach and esophagus biopsies showed chronic inflammation. There was no intestinal metaplasia, dysplasia, and malignancy.  Patient reports feeling extremely tired today.  She missed her appointment for vitamin B12 injection last months.  She had close contact with a friend who was recently diagnosed with strep.  Patient reports some scratchy sore throat and swelling of the left neck.  No fever, chills, she took and COVID testing at home and was negative.   INTERVAL HISTORY Nichole Gentry is a 48 y.o. female who has above history reviewed by me today presents for follow up visit for Iron  deficiency anemia and B12 deficiency.   She has missed her recent monthly B12 injections due to busy work schedule.   She reports feeling tired. She has also noticed lower back pain radiate to groin area.  Past Medical History:  Diagnosis Date   ADD (attention deficit disorder)    Anemia    B12 deficiency    GERD (gastroesophageal reflux disease)    Low iron     Migraine    1-2x/month   Simple febrile convulsions (HCC)    as infant    Past Surgical History:  Procedure Laterality Date   BUNIONECTOMY Right 06/18/2022   Procedure: BUNIONECTOMY LAPIDUS TYPE;  Surgeon: Anell Baptist, DPM;  Location: [facility];  Service: Podiatry;  Laterality:  Right;   ESOPHAGOGASTRODUODENOSCOPY (EGD) WITH PROPOFOL  N/A 11/07/2019   Procedure: ESOPHAGOGASTRODUODENOSCOPY (EGD) WITH PROPOFOL ;  Surgeon: Shane Darling, MD;  Location: [facility];  Service: Endoscopy;  Laterality: N/A;   HIATAL HERNIA REPAIR     LAPAROSCOPIC REVISION OF ROUXENY WITH UPPER ENDOSCOPY     linx management system      Family History  Problem Relation Age of Onset   Thyroid  disease Mother    Diabetes Mother    Hyperlipidemia Father    Lung cancer Father    Brain cancer Father     Social History:   reports that she has never smoked. She has never used smokeless tobacco. She reports current alcohol use. She reports that she does not use drugs. The patient is alone today.  Allergies: No Known Allergies  Current Medications: Current Outpatient Medications  Medication Sig Dispense Refill   amphetamine-dextroamphetamine (ADDERALL) 10 MG tablet Take 10 mg by mouth daily as needed.     Butalbital-APAP-Caffeine 50-325-40 MG capsule Take by mouth as needed for headache.     cyanocobalamin  (,VITAMIN B-12,) 1000 MCG/ML injection Inject 1 mL (1,000 mcg total) into the muscle every 30 (thirty) days. Administer weekly x 3 and then monthly. 1 mL 4   LORazepam (ATIVAN) 0.5 MG tablet Take 0.5 mg by mouth 2 (two) times daily as needed.     norethindrone  (ORTHO MICRONOR ) 0.35 MG tablet Take 1 tablet (0.35 mg total) by mouth daily. 84 tablet 3   pantoprazole (PROTONIX) 40 MG tablet Take 40 mg by mouth daily.     SYRINGE-NEEDLE, DISP, 3 ML (BD SAFETYGLIDE SYRINGE/NEEDLE) 25G X 1" 3 ML MISC Use the needle for b12 injection once a month. 5 each 0   valACYclovir  (VALTREX ) 1000 MG tablet Take 1 tablet (1,000 mg total) by mouth daily. 30 tablet 12   VYVANSE 70 MG capsule TAKE 1 CAPSULE BY MOUTH IN THE MORNING ONCE DAILY FOR 30 DAYS  0   zolpidem (AMBIEN) 10 MG tablet Take 10 mg by mouth at bedtime as needed.     pregabalin  (LYRICA ) 75 MG capsule Take 1 capsule by mouth twice daily (Patient not taking: Reported on 06/02/2023) 60 capsule 0   No current facility-administered medications for this visit.     Review of Systems  Constitutional:  Positive for fatigue. Negative for appetite change, chills and fever.  HENT:   Negative for hearing loss and voice change.   Eyes:  Negative for eye problems.  Respiratory:  Negative for chest tightness and cough.   Cardiovascular:  Negative for chest pain.  Gastrointestinal:  Negative for abdominal distention, abdominal pain and blood in stool.  Endocrine: Negative for  hot flashes.  Genitourinary:  Negative for difficulty urinating and frequency.   Musculoskeletal:  Positive for back pain. Negative for arthralgias.  Skin:  Negative for itching and rash.  Neurological:  Negative for extremity weakness.  Hematological:  Negative for adenopathy.  Psychiatric/Behavioral:  Negative for confusion.       Vitals Blood pressure 122/82, pulse 97, temperature (!) 96.8 F (36 C), temperature source Tympanic, resp. rate 16, weight 142 lb (64.4 kg).  Performance status (ECOG): 0  Physical Exam Constitutional:      General: She is not in acute distress.    Appearance: She is not diaphoretic.  HENT:     Head: Normocephalic and atraumatic.  Eyes:     General: No scleral icterus. Cardiovascular:     Rate and Rhythm: Normal rate and regular rhythm.  Pulmonary:  Effort: Pulmonary effort is normal. No respiratory distress.  Abdominal:     General: Bowel sounds are normal. There is no distension.     Palpations: Abdomen is soft.  Musculoskeletal:        General: Normal range of motion.     Cervical back: Normal range of motion and neck supple.  Lymphadenopathy:     Cervical: No cervical adenopathy.  Skin:    General: Skin is warm.     Findings: No erythema.  Neurological:     Mental Status: She is alert and oriented to person, place, and time. Mental status is at baseline.     Motor: No abnormal muscle tone.  Psychiatric:        Mood and Affect: Mood and affect normal.      Labs are reviewed     Latest Ref Rng & Units 05/29/2023   10:40 AM 11/28/2022   10:16 AM 03/24/2022   10:39 AM  CBC  WBC 4.0 - 10.5 K/uL 4.5  5.9  4.6   Hemoglobin 12.0 - 15.0 g/dL 16.1  09.6  04.5   Hematocrit 36.0 - 46.0 % 39.5  37.8  41.2   Platelets 150 - 400 K/uL 217  190  238       Latest Ref Rng & Units 05/29/2023   10:40 AM 11/28/2022   10:17 AM 08/29/2020    8:56 AM  CMP  Glucose 70 - 99 mg/dL 409  95  811   BUN 6 - 20 mg/dL 14  15  19    Creatinine 0.44 -  1.00 mg/dL 9.14  7.82  9.56   Sodium 135 - 145 mmol/L 135  136  136   Potassium 3.5 - 5.1 mmol/L 3.8  3.4  3.7   Chloride 98 - 111 mmol/L 104  104  103   CO2 22 - 32 mmol/L 24  26  27    Calcium 8.9 - 10.3 mg/dL 8.6  8.8  8.4   Total Protein 6.5 - 8.1 g/dL 6.7  6.3  6.9   Total Bilirubin 0.0 - 1.2 mg/dL 0.6  0.6  0.7   Alkaline Phos 38 - 126 U/L 63  70  62   AST 15 - 41 U/L 19  18  18    ALT 0 - 44 U/L 12  14  15

## 2023-06-02 NOTE — Assessment & Plan Note (Signed)
 B12 level is at low normal end.  continue monthly parenteral vitamin B12 injection 1000 MCG

## 2023-06-02 NOTE — Progress Notes (Signed)
Patient reports an increase in fatigue.

## 2023-07-02 ENCOUNTER — Inpatient Hospital Stay: Attending: Internal Medicine

## 2023-07-02 DIAGNOSIS — K9589 Other complications of other bariatric procedure: Secondary | ICD-10-CM

## 2023-07-02 DIAGNOSIS — E538 Deficiency of other specified B group vitamins: Secondary | ICD-10-CM | POA: Insufficient documentation

## 2023-07-02 MED ORDER — CYANOCOBALAMIN 1000 MCG/ML IJ SOLN
1000.0000 ug | Freq: Once | INTRAMUSCULAR | Status: AC
  Administered 2023-07-02: 1000 ug via INTRAMUSCULAR
  Filled 2023-07-02: qty 1

## 2023-07-13 DIAGNOSIS — F411 Generalized anxiety disorder: Secondary | ICD-10-CM | POA: Diagnosis not present

## 2023-07-13 DIAGNOSIS — F909 Attention-deficit hyperactivity disorder, unspecified type: Secondary | ICD-10-CM | POA: Diagnosis not present

## 2023-08-03 ENCOUNTER — Inpatient Hospital Stay: Attending: Internal Medicine

## 2023-08-11 ENCOUNTER — Inpatient Hospital Stay: Attending: Internal Medicine

## 2023-08-11 DIAGNOSIS — E538 Deficiency of other specified B group vitamins: Secondary | ICD-10-CM | POA: Insufficient documentation

## 2023-08-11 DIAGNOSIS — K9589 Other complications of other bariatric procedure: Secondary | ICD-10-CM

## 2023-08-11 DIAGNOSIS — Z79899 Other long term (current) drug therapy: Secondary | ICD-10-CM | POA: Insufficient documentation

## 2023-08-11 MED ORDER — CYANOCOBALAMIN 1000 MCG/ML IJ SOLN
1000.0000 ug | Freq: Once | INTRAMUSCULAR | Status: AC
  Administered 2023-08-11: 1000 ug via INTRAMUSCULAR
  Filled 2023-08-11: qty 1

## 2023-09-02 ENCOUNTER — Inpatient Hospital Stay

## 2023-10-06 ENCOUNTER — Inpatient Hospital Stay: Attending: Internal Medicine

## 2023-10-06 DIAGNOSIS — Z79899 Other long term (current) drug therapy: Secondary | ICD-10-CM | POA: Diagnosis not present

## 2023-10-06 DIAGNOSIS — K9589 Other complications of other bariatric procedure: Secondary | ICD-10-CM

## 2023-10-06 DIAGNOSIS — E538 Deficiency of other specified B group vitamins: Secondary | ICD-10-CM | POA: Diagnosis not present

## 2023-10-06 MED ORDER — CYANOCOBALAMIN 1000 MCG/ML IJ SOLN
1000.0000 ug | Freq: Once | INTRAMUSCULAR | Status: AC
  Administered 2023-10-06: 1000 ug via INTRAMUSCULAR
  Filled 2023-10-06: qty 1

## 2023-10-07 ENCOUNTER — Other Ambulatory Visit: Payer: Self-pay

## 2023-10-07 ENCOUNTER — Ambulatory Visit: Admitting: Licensed Practical Nurse

## 2023-10-07 VITALS — BP 122/90 | HR 85 | Ht 66.0 in | Wt 131.1 lb

## 2023-10-07 DIAGNOSIS — M545 Low back pain, unspecified: Secondary | ICD-10-CM

## 2023-10-07 DIAGNOSIS — Z7689 Persons encountering health services in other specified circumstances: Secondary | ICD-10-CM

## 2023-10-07 DIAGNOSIS — R609 Edema, unspecified: Secondary | ICD-10-CM

## 2023-10-07 DIAGNOSIS — N898 Other specified noninflammatory disorders of vagina: Secondary | ICD-10-CM | POA: Diagnosis not present

## 2023-10-07 DIAGNOSIS — N951 Menopausal and female climacteric states: Secondary | ICD-10-CM

## 2023-10-07 LAB — POCT URINALYSIS DIPSTICK
Bilirubin, UA: NEGATIVE
Blood, UA: NEGATIVE
Glucose, UA: NEGATIVE
Ketones, UA: NEGATIVE
Leukocytes, UA: NEGATIVE
Nitrite, UA: NEGATIVE
Protein, UA: POSITIVE — AB
Spec Grav, UA: 1.025 (ref 1.010–1.025)
Urobilinogen, UA: 0.2 U/dL
pH, UA: 6.5 (ref 5.0–8.0)

## 2023-10-07 MED ORDER — ESTRADIOL 0.1 MG/GM VA CREA: 1.0000 g | TOPICAL_CREAM | Freq: Every day | VAGINAL | 1 refills | Status: DC

## 2023-10-07 MED ORDER — ESTRADIOL 0.1 MG/GM VA CREA: 1.0000 g | TOPICAL_CREAM | Freq: Every day | VAGINAL | Status: DC

## 2023-10-08 ENCOUNTER — Other Ambulatory Visit: Payer: Self-pay

## 2023-10-08 DIAGNOSIS — N951 Menopausal and female climacteric states: Secondary | ICD-10-CM

## 2023-10-08 MED ORDER — ESTRADIOL 0.1 MG/GM VA CREA: 1.0000 g | TOPICAL_CREAM | Freq: Every day | VAGINAL | 1 refills | Status: AC

## 2023-10-09 ENCOUNTER — Other Ambulatory Visit: Payer: Self-pay | Admitting: Licensed Practical Nurse

## 2023-10-09 ENCOUNTER — Ambulatory Visit: Payer: Self-pay | Admitting: Licensed Practical Nurse

## 2023-10-09 DIAGNOSIS — B9689 Other specified bacterial agents as the cause of diseases classified elsewhere: Secondary | ICD-10-CM

## 2023-10-09 LAB — NUSWAB VAGINITIS PLUS (VG+)
BVAB 2: HIGH {score} — AB
Candida albicans, NAA: NEGATIVE
Candida glabrata, NAA: NEGATIVE
Chlamydia trachomatis, NAA: NEGATIVE
Megasphaera 1: HIGH {score} — AB
Neisseria gonorrhoeae, NAA: NEGATIVE
Trich vag by NAA: NEGATIVE

## 2023-10-09 LAB — URINE CULTURE: Organism ID, Bacteria: NO GROWTH

## 2023-10-09 MED ORDER — METRONIDAZOLE 500 MG PO TABS: 500.0000 mg | ORAL_TABLET | Freq: Two times a day (BID) | ORAL | 0 refills | Status: DC

## 2023-10-09 NOTE — Progress Notes (Signed)
 Fortunato Peaks, MD   No chief complaint on file.   HPI:      Nichole Gentry is a 48 y.o. G4P0 whose LMP was No LMP recorded., presents today for vaginal odor and pelvic pain and back pain and additional complaints   Vaginal odor -it is stronger after sex and at the  start of her cycle -has not seen too much discharge   Low back pain -The pain is so severe it makes it difficult to walk -the pain wraps around  to the lower abdomen, it is is sharp and heavy -denies any accidents or injuries  -the pain occurs daily, Tylenol , heat, and stretching do not help.   Pain with IC -the pain is sharp, starts when her partner enters, the pain will remain in her lower abdomen for some time after IC   Vaginal dryness -has tried vaginal Moisturizer  Burning with urination x couple of months  -Has noticed her urine has a sulfur smell -denies fevers, urgency, or frequency   Cycles are monthly, lasting 4 days, every once is a while she will have a heavy flow  She is not taking POP Does not use tobacco or nicotine, may have Migraines with aura, her BP is noted to be elevated today   Pt also reports she has noticed significant swelling in her lower extremities. She no longer has a PCP.        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   Postoperative anemia due to acute blood loss 06/03/2017   GERD (gastroesophageal reflux disease) 03/10/2017    Past Surgical History:  Procedure Laterality Date   BUNIONECTOMY Right 06/18/2022   Procedure: BUNIONECTOMY LAPIDUS TYPE;  Surgeon: Ashley Soulier, DPM;  Location[facility];  Service: Podiatry;  Laterality: Right;   ESOPHAGOGASTRODUODENOSCOPY  (EGD) WITH PROPOFOL  N/A 11/07/2019   Procedure: ESOPHAGOGASTRODUODENOSCOPY (EGD) WITH PROPOFOL ;  Surgeon: Maryruth Ole DASEN, MD;  Location: [facility];  Service: Endoscopy;  Laterality: N/A;   HIATAL HERNIA REPAIR     LAPAROSCOPIC REVISION OF ROUXENY WITH UPPER ENDOSCOPY     linx management system      Family History  Problem Relation Age of Onset   Thyroid  disease Mother    Diabetes Mother    Hyperlipidemia Father    Lung cancer Father    Brain cancer Father     Social History   Socioeconomic History   Marital status: Single    Spouse name: Not on file   Number of children: Not on file   Years of education: Not on file   Highest education level: Not on file  Occupational History   Not on file  Tobacco Use   Smoking status: Never   Smokeless tobacco: Never  Vaping Use   Vaping status: Never Used  Substance and Sexual Activity   Alcohol use: Yes    Comment: occasionally   Drug use: Never   Sexual activity: Yes    Birth control/protection: None  Other Topics Concern   Not on file  Social History Narrative   Not on file   Social Drivers of Health   Financial Resource Strain: Not on file  Food Insecurity: Food Insecurity Present (04/03/2022)   Hunger Vital Sign  Worried About Programme researcher, broadcasting/film/video in the Last Year: Sometimes true    [employer] of Food in the Last Year: Sometimes true  Transportation Needs: No Transportation Needs (04/03/2022)   PRAPARE - Administrator, Civil Service (Medical): No    Lack of Transportation (Non-Medical): No  Physical Activity: Not on file  Stress: Not on file  Social Connections: Not on file  Intimate Partner Violence: Not At Risk (04/03/2022)   Humiliation, Afraid, Rape, and Kick questionnaire    Fear of Current or Ex-Partner: No    Emotionally Abused: No    Physically Abused: No    Sexually Abused: No    Outpatient Medications Prior to Visit  Medication Sig Dispense Refill   amphetamine-dextroamphetamine  (ADDERALL) 10 MG tablet Take 10 mg by mouth daily as needed.     cyanocobalamin  (,VITAMIN B-12,) 1000 MCG/ML injection Inject 1 mL (1,000 mcg total) into the muscle every 30 (thirty) days. Administer weekly x 3 and then monthly. 1 mL 4   LORazepam (ATIVAN) 0.5 MG tablet Take 0.5 mg by mouth 2 (two) times daily as needed.     SYRINGE-NEEDLE, DISP, 3 ML (BD SAFETYGLIDE SYRINGE/NEEDLE) 25G X 1 3 ML MISC Use the needle for b12 injection once a month. 5 each 0   valACYclovir  (VALTREX ) 1000 MG tablet Take 1 tablet (1,000 mg total) by mouth daily. 30 tablet 12   VYVANSE 70 MG capsule TAKE 1 CAPSULE BY MOUTH IN THE MORNING ONCE DAILY FOR 30 DAYS  0   zolpidem (AMBIEN) 10 MG tablet Take 10 mg by mouth at bedtime as needed.     Butalbital-APAP-Caffeine 50-325-40 MG capsule Take by mouth as needed for headache. (Patient not taking: Reported on 10/07/2023)     pantoprazole (PROTONIX) 40 MG tablet Take 40 mg by mouth daily. (Patient not taking: Reported on 10/07/2023)     pregabalin  (LYRICA ) 75 MG capsule Take 1 capsule by mouth twice daily (Patient not taking: Reported on 06/02/2023) 60 capsule 0   norethindrone  (ORTHO MICRONOR ) 0.35 MG tablet Take 1 tablet (0.35 mg total) by mouth daily. 84 tablet 3   No facility-administered medications prior to visit.      ROS:  Review of Systems see HPI    OBJECTIVE:   Vitals:  BP (!) 122/90 (BP Location: Right Arm, Patient Position: Sitting, Cuff Size: Normal)   Pulse 85   Ht 5' 6 (1.676 m)   Wt 131 lb 1.6 oz (59.5 kg)   BMI 21.16 kg/m   Physical Exam Constitutional:      Appearance: Normal appearance.  Abdominal:     General: Abdomen is flat.     Tenderness: There is no abdominal tenderness. There is no guarding.  Genitourinary:    Comments: Some labial atrophy, prominent urethra   SSE: cervix pink, no lesions, minimal discharge present  Bimanual exam: uterus non gravid, non tender no masses, adnexa non tender no masse not enlarged, no CMT    Musculoskeletal:     Right lower leg: Edema present.     Left lower leg: Edema present.  Skin:    General: Skin is warm.  Neurological:     General: No focal deficit present.     Mental Status: She is alert.  Psychiatric:        Mood and Affect: Mood normal.        Thought Content: Thought content normal.    Pelvis US  09/09/22: Endometrium 4.44mm Left simple ovarian cyst   Results:  No results found for this or any previous visit (from the past 24 hours).   Assessment/Plan: Vaginal odor - Plan: NuSwab Vaginitis Plus (VG+), Urine Culture  Perimenopause - Plan: DISCONTINUED: estradiol  (ESTRACE  VAGINAL) 0.1 MG/GM vaginal cream  Low back pain, unspecified back pain laterality, unspecified chronicity, unspecified whether sciatica present - Plan: POCT Urinalysis Dipstick, NuSwab Vaginitis Plus (VG+), Urine Culture  Encounter to establish care - Plan: Ambulatory referral to Family Practice  Swelling - Plan: Ambulatory referral to Family Practice  Acute midline low back pain without sciatica - Plan: Ambulatory referral to [facility]    Meds ordered this encounter  Medications   DISCONTD: estradiol  (ESTRACE  VAGINAL) 0.1 MG/GM vaginal cream    Sig: Place 1 g vaginally daily. Apply daily x 2 weeks then taper to 2 times per week    -Referral to PCP as some of her complaints are better managed by them -She may consider Chiro or PT for the back/pelvic pain as there could be a muscular-skeletal  component to her pain .   -She could have adhesions from previous surgeries that are causing the lower abdominal  pain-rec castor oil packs  -Given normal Pelvic US  09/2022, may monitor symptoms and consider an US  in the future   JINNIE HERO [facility], CNM 10/09/2023 12:09 PM

## 2023-10-09 NOTE — Progress Notes (Signed)
 Pt reported vaginal odor at visit, swab shows BV. Script for Flagyl  sent Jinnie Cookey, [place]  [facility] 10/09/23  9:05 AM

## 2023-10-26 ENCOUNTER — Telehealth: Payer: Self-pay

## 2023-10-26 ENCOUNTER — Other Ambulatory Visit: Payer: Self-pay

## 2023-10-26 DIAGNOSIS — B009 Herpesviral infection, unspecified: Secondary | ICD-10-CM

## 2023-10-26 MED ORDER — VALACYCLOVIR HCL 1 G PO TABS: 1000.0000 mg | ORAL_TABLET | Freq: Every day | ORAL | 0 refills | Status: DC

## 2023-10-26 NOTE — Telephone Encounter (Signed)
 Patient made aware she is due for an annual prior to refills on medications via MyChart.

## 2023-10-28 DIAGNOSIS — F411 Generalized anxiety disorder: Secondary | ICD-10-CM | POA: Diagnosis not present

## 2023-10-28 DIAGNOSIS — F329 Major depressive disorder, single episode, unspecified: Secondary | ICD-10-CM | POA: Diagnosis not present

## 2023-10-28 DIAGNOSIS — F909 Attention-deficit hyperactivity disorder, unspecified type: Secondary | ICD-10-CM | POA: Diagnosis not present

## 2023-11-05 ENCOUNTER — Inpatient Hospital Stay: Attending: Internal Medicine

## 2023-11-05 DIAGNOSIS — Z803 Family history of malignant neoplasm of breast: Secondary | ICD-10-CM | POA: Insufficient documentation

## 2023-11-05 DIAGNOSIS — Z79624 Long term (current) use of inhibitors of nucleotide synthesis: Secondary | ICD-10-CM | POA: Diagnosis not present

## 2023-11-05 DIAGNOSIS — K9589 Other complications of other bariatric procedure: Secondary | ICD-10-CM

## 2023-11-05 DIAGNOSIS — R5383 Other fatigue: Secondary | ICD-10-CM | POA: Diagnosis not present

## 2023-11-05 DIAGNOSIS — D509 Iron deficiency anemia, unspecified: Secondary | ICD-10-CM | POA: Insufficient documentation

## 2023-11-05 DIAGNOSIS — Z9884 Bariatric surgery status: Secondary | ICD-10-CM | POA: Insufficient documentation

## 2023-11-05 DIAGNOSIS — E538 Deficiency of other specified B group vitamins: Secondary | ICD-10-CM | POA: Diagnosis not present

## 2023-11-05 DIAGNOSIS — Z800 Family history of malignant neoplasm of digestive organs: Secondary | ICD-10-CM | POA: Insufficient documentation

## 2023-11-05 DIAGNOSIS — Z801 Family history of malignant neoplasm of trachea, bronchus and lung: Secondary | ICD-10-CM | POA: Insufficient documentation

## 2023-11-05 DIAGNOSIS — Z79899 Other long term (current) drug therapy: Secondary | ICD-10-CM | POA: Diagnosis not present

## 2023-11-05 DIAGNOSIS — K449 Diaphragmatic hernia without obstruction or gangrene: Secondary | ICD-10-CM | POA: Insufficient documentation

## 2023-11-05 DIAGNOSIS — K219 Gastro-esophageal reflux disease without esophagitis: Secondary | ICD-10-CM | POA: Diagnosis not present

## 2023-11-05 DIAGNOSIS — G629 Polyneuropathy, unspecified: Secondary | ICD-10-CM | POA: Diagnosis not present

## 2023-11-05 MED ORDER — CYANOCOBALAMIN 1000 MCG/ML IJ SOLN
1000.0000 ug | Freq: Once | INTRAMUSCULAR | Status: AC
  Administered 2023-11-05: 1000 ug via INTRAMUSCULAR
  Filled 2023-11-05: qty 1

## 2023-11-18 ENCOUNTER — Encounter: Payer: Self-pay | Admitting: Licensed Practical Nurse

## 2023-11-18 ENCOUNTER — Ambulatory Visit: Admitting: Licensed Practical Nurse

## 2023-11-18 ENCOUNTER — Other Ambulatory Visit (HOSPITAL_COMMUNITY)
Admission: RE | Admit: 2023-11-18 | Discharge: 2023-11-18 | Disposition: A | Discharge status: Home / Self Care | Source: Ambulatory Visit | Attending: Licensed Practical Nurse | Admitting: Licensed Practical Nurse

## 2023-11-18 VITALS — BP 121/88 | HR 88 | Ht 66.0 in | Wt 133.3 lb

## 2023-11-18 DIAGNOSIS — Z124 Encounter for screening for malignant neoplasm of cervix: Secondary | ICD-10-CM

## 2023-11-18 DIAGNOSIS — K219 Gastro-esophageal reflux disease without esophagitis: Secondary | ICD-10-CM | POA: Diagnosis not present

## 2023-11-18 DIAGNOSIS — R5383 Other fatigue: Secondary | ICD-10-CM

## 2023-11-18 DIAGNOSIS — Z1231 Encounter for screening mammogram for malignant neoplasm of breast: Secondary | ICD-10-CM

## 2023-11-18 DIAGNOSIS — Z1151 Encounter for screening for human papillomavirus (HPV): Secondary | ICD-10-CM | POA: Diagnosis not present

## 2023-11-18 DIAGNOSIS — Z113 Encounter for screening for infections with a predominantly sexual mode of transmission: Secondary | ICD-10-CM | POA: Insufficient documentation

## 2023-11-18 DIAGNOSIS — Z01419 Encounter for gynecological examination (general) (routine) without abnormal findings: Secondary | ICD-10-CM

## 2023-11-18 DIAGNOSIS — Z7689 Persons encountering health services in other specified circumstances: Secondary | ICD-10-CM

## 2023-11-19 ENCOUNTER — Ambulatory Visit: Payer: Self-pay | Admitting: Licensed Practical Nurse

## 2023-11-19 ENCOUNTER — Encounter: Payer: Self-pay | Admitting: Licensed Practical Nurse

## 2023-11-19 LAB — CBC WITH DIFFERENTIAL/PLATELET
Basophils Absolute: 0.1 x10E3/uL (ref 0.0–0.2)
Basos: 1 %
EOS (ABSOLUTE): 0.1 x10E3/uL (ref 0.0–0.4)
Eos: 2 %
Hematocrit: 47 % — ABNORMAL HIGH (ref 34.0–46.6)
Hemoglobin: 15.9 g/dL (ref 11.1–15.9)
Immature Grans (Abs): 0 x10E3/uL (ref 0.0–0.1)
Immature Granulocytes: 0 %
Lymphocytes Absolute: 1.6 x10E3/uL (ref 0.7–3.1)
Lymphs: 26 %
MCH: 31.5 pg (ref 26.6–33.0)
MCHC: 33.8 g/dL (ref 31.5–35.7)
MCV: 93 fL (ref 79–97)
Monocytes Absolute: 0.4 x10E3/uL (ref 0.1–0.9)
Monocytes: 6 %
Neutrophils Absolute: 4 x10E3/uL (ref 1.4–7.0)
Neutrophils: 65 %
Platelets: 254 x10E3/uL (ref 150–450)
RBC: 5.05 x10E6/uL (ref 3.77–5.28)
RDW: 12.5 % (ref 11.7–15.4)
WBC: 6.2 x10E3/uL (ref 3.4–10.8)

## 2023-11-19 LAB — COMPREHENSIVE METABOLIC PANEL WITH GFR
ALT: 18 IU/L (ref 0–32)
AST: 19 IU/L (ref 0–40)
Albumin: 4.9 g/dL (ref 3.9–4.9)
Alkaline Phosphatase: 101 IU/L (ref 41–116)
BUN/Creatinine Ratio: 29 — ABNORMAL HIGH (ref 9–23)
BUN: 29 mg/dL — ABNORMAL HIGH (ref 6–24)
Bilirubin Total: 0.4 mg/dL (ref 0.0–1.2)
CO2: 21 mmol/L (ref 20–29)
Calcium: 9.2 mg/dL (ref 8.7–10.2)
Chloride: 97 mmol/L (ref 96–106)
Creatinine, Ser: 1.01 mg/dL — ABNORMAL HIGH (ref 0.57–1.00)
Globulin, Total: 2.5 g/dL (ref 1.5–4.5)
Glucose: 161 mg/dL — ABNORMAL HIGH (ref 70–99)
Potassium: 4.1 mmol/L (ref 3.5–5.2)
Sodium: 137 mmol/L (ref 134–144)
Total Protein: 7.4 g/dL (ref 6.0–8.5)
eGFR: 69 mL/min/1.73 (ref >59)

## 2023-11-19 LAB — HEPATITIS C ANTIBODY: Hep C Virus Ab: NONREACTIVE

## 2023-11-19 LAB — HEP, RPR, HIV PANEL
HIV Screen 4th Generation wRfx: NONREACTIVE
Hepatitis B Surface Ag: NEGATIVE
RPR Ser Ql: NONREACTIVE

## 2023-11-19 LAB — TSH+FREE T4
Free T4: 1.48 ng/dL (ref 0.82–1.77)
TSH: 1.24 u[IU]/mL (ref 0.450–4.500)

## 2023-11-19 NOTE — Progress Notes (Signed)
 Gynecology Annual Exam  PCP: Fortunato Peaks, MD  Chief Complaint:  Chief Complaint  Patient presents with   Gynecologic Exam    History of Present Illness: Patient is a 48 y.o. G4P0 presents for annual exam. The patient has experienced leg cramps and pain in her low back-would like her electrolytes checked. She would like to be referred to PCP as no one has called about a PCP yet (pt was previously referred). She has a long hx of GI complaints, has not seen GI in some time, she would like a referral.   She has started the vaginal estrogen, feels an improvement since starting.   LMP: Patient's last menstrual period was 11/06/2023 (exact date). Average Interval: regular, monthly but may skip 1 month, sometimes she has 2 cycles in a month Duration of flow: 4 days Heavy Menses: yes, occasionally floods clothing  Clots: yes Intermenstrual Bleeding: no Postcoital Bleeding: no Dysmenorrhea: yes, back pain    The patient is sexually active with 1 female partner. She currently uses none for contraception. She denies dyspareunia.  The patient does perform self breast exams.  There is no notable family history of breast or ovarian cancer in her family.  The patient wears seatbelts: yes.   The patient has regular exercise: walks a lot at work .    The patient denies current symptoms of depression.  Has hx of anxiety, has Lorazepam PRN, but has not used used. Using breathing techniques  Works for the [employer], admits to high stress level d/t her job  Lives with her children ages 93, 11, and 21 Has a partner, feels safe PCP needs Dental up to date Uses reading glasses, eye exam up to date  Denies tobacco/nicotine, or illicit drugs Has an occasional alcoholic  drink   Review of Systems: Review of Systems  Constitutional:  Positive for malaise/fatigue and weight loss.  HENT:         Ear pressure   Eyes: Negative.   Respiratory: Negative.    Cardiovascular: Negative.    Gastrointestinal:  Positive for abdominal pain, constipation and diarrhea.  Genitourinary: Negative.   Musculoskeletal:  Positive for back pain.       Restless legs Legs cramps   Neurological: Negative.   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 Referred from Rosaline Louder, [place], [facility], [facility] Eleaner Dibartolo [id number] is a 48 y.o. female referred to evaluate for surgical repair of GERD.  Patient presented to [facility] on 02/19/17 for reflux symptoms for over 10 years.  She had EGDs done in 2012 and 2015 at OSH.  The 2012 EGD showed a small hiatal hernia and the pathology showed erosive esophagitis.  The 2015 EGD showed erosions at the GE junction; there was no mention of hiatal hernia; pathology showed mild reactive change/negative for Barrett's esophagus.  She  has attempted multiple medications, including Aciphex (rabeprazole), Nexium (esomeprazole), Protonix, (pantoprazole), Prevacid (lansoprazole), Zantac (ranitidine), and carafate.  She is currently taking Protonix (pantoprazole) 40 mg twice daily.  She has tried dietary changes, lifestyle changes (eating smaller meals, elevating head of bed, lying on left side, avoiding food before bedtime).  She drinks alcohol 2-3 times a month.  She reports dysphagia and odynophagia when eating meat and rice.  No NSAID use or smoking.   Barium swallow was done 03/05/17 at [facility] that shows a small hiatal hernia and severe gastroesophageal reflux extending to the level of the thoracic inlet.    Of note, the 07/01/13 EGD report mentions evidence of previous gastric sleeve.  Discussed with patient, who confirms gastric sleeve was done ~11 years ago in [place], [place].  Sleeve remains in place.  Patient to look in home records to see if she has a copy of an operative report.    Smoking status: never smoker  Anticoagulation: none  Barium Swallow (03/05/17 [facility] - see CareEverywhere for report) IMPRESSION: 1. Small hiatal hernia. Severe gastroesophageal reflux extending to the level of the thoracic inlet.  EGD (07/01/13 [facility], [place], [place] - report scanned in Epic in 02/19/17 GI Consult visit) Findings: Esophagus  Excavated lesions:  A few localized erosions were noted in the gastoesophageal junction.  Multiple cold forceps biopsies were performed for histology. Stomach  Lumen:  Evidence of a previous gastric sleeve was seen in the stomach. Duodenum:  Normal duodenum.   Impressions:  Normal duodenum.  Erosions in the gastroesphageal junction.  (Biopsy)  Gastric previous surgery.    Pathology EGD (07/01/13) Diagnosis: EG Junction:  Mild reactive change, probably reflux associated, negative for Barrett's esophagus.   EGD (05/17/10 [facility] - not available in CareEverywhere; report scanned into Epic 02/19/17 GI Consult note) Assessment: 1.  Grade A reflux esophagitis. 2.  Small hiatal hernia. 3.  Bile gastritis.  Pathology - EGD (05/17/10) Final Diagnosis: 1.  Gastric Mucosa, Biopsies: Fragments of gastric fundic mucosa without specific histopathologic abnormalities, including no active inflammatory infiltrates, metaplasia, or dysplasia. Negative for helicobacter colonization on the Giemsa stained section.     Past Surgical History:   Past Surgical History:  Procedure Laterality Date   BUNIONECTOMY Right 06/18/2022   Procedure: BUNIONECTOMY LAPIDUS TYPE;  Surgeon: Ashley Soulier, DPM;  Location: [facility];  Service: Podiatry;  Laterality: Right;   ESOPHAGOGASTRODUODENOSCOPY (EGD) WITH PROPOFOL  N/A 11/07/2019   Procedure: ESOPHAGOGASTRODUODENOSCOPY (EGD) WITH PROPOFOL ;  Surgeon: Maryruth Ole DASEN, MD;  Location: [facility];  Service: Endoscopy;  Laterality: N/A;   HIATAL HERNIA REPAIR     LAPAROSCOPIC REVISION OF ROUXENY WITH UPPER ENDOSCOPY     linx management system      Gynecologic History:  Patient's last menstrual period was 11/06/2023 (exact date). Contraception: none Last Pap: Results were: 2022 no abnormalities  Last mammogram: not on file   Obstetric History: G4P0  Family History:  Family History  Problem Relation Age of Onset   Thyroid  disease Mother    Diabetes Mother    Hyperlipidemia Father    Lung cancer Father    Brain cancer Father     Social History:  Social History   Socioeconomic History   Marital status: Single    Spouse name: Not on file   Number of children: Not on file   Years of education: Not on file   Highest education level: Not on file  Occupational History   Not on  file  Tobacco Use   Smoking status: Never   Smokeless tobacco: Never  Vaping Use   Vaping status: Never Used  Substance and Sexual Activity   Alcohol use: Yes    Comment: occasionally   Drug use: Never   Sexual activity: Yes    Birth control/protection: None  Other Topics Concern   Not on file  Social History Narrative   Not on file   Social Drivers of Health   Financial Resource Strain: Not on file  Food Insecurity: Food Insecurity Present (04/03/2022)   Hunger Vital Sign    Worried About Running Out of Food in the Last Year: Sometimes true    Ran Out of Food in the Last Year: Sometimes true  Transportation Needs: No Transportation Needs (04/03/2022)   PRAPARE - Therapist, art (Medical): No    Lack of Transportation (Non-Medical): No  Physical Activity: Not on file  Stress: Not on file  Social Connections: Not on file  Intimate Partner Violence: Not At Risk (04/03/2022)   Humiliation, Afraid, Rape, and Kick questionnaire    Fear of Current or Ex-Partner: No    Emotionally Abused: No    Physically Abused: No    Sexually Abused: No    Allergies:  No Known Allergies  Medications: Prior to Admission medications   Medication Sig Start Date End Date Taking? Authorizing Provider  amphetamine-dextroamphetamine (ADDERALL) 10 MG tablet Take 10 mg by mouth daily as needed. 03/12/21   [provider]  Butalbital-APAP-Caffeine 50-325-40 MG capsule Take by mouth as needed for headache. Patient not taking: Reported on 10/07/2023    [provider]  cyanocobalamin  (,VITAMIN B-12,) 1000 MCG/ML injection Inject 1 mL (1,000 mcg total) into the muscle every 30 (thirty) days. Administer weekly x 3 and then monthly. 04/11/21   Dasie Tinnie MATSU, NP  estradiol  (ESTRACE  VAGINAL) 0.1 MG/GM vaginal cream Place 1 g vaginally daily. Apply daily x 2 weeks then taper to 2 times per week 10/08/23   Deneene Tarver, Jinnie Jansky, CNM  LORazepam (ATIVAN) 0.5 MG tablet Take 0.5 mg by mouth 2 (two) times daily as needed. 10/28/19   [provider]  metroNIDAZOLE  (FLAGYL ) 500 MG tablet Take 1 tablet (500 mg total) by mouth 2 (two) times daily. 10/09/23   Murel Wigle, Jinnie Jansky, CNM  pantoprazole (PROTONIX) 40 MG tablet Take 40 mg by mouth daily. Patient not taking: Reported on 10/07/2023    [provider]  pregabalin  (LYRICA ) 75 MG capsule Take 1 capsule by mouth twice daily Patient not taking: Reported on 06/02/2023 12/26/22   Vaslow, Zachary K, MD  SYRINGE-NEEDLE, DISP, 3 ML (BD SAFETYGLIDE SYRINGE/NEEDLE) 25G X 1 3 ML MISC Use the needle for b12 injection once a month. 03/14/21   Dasie Tinnie MATSU, NP  valACYclovir  (VALTREX ) 1000 MG tablet Take 1 tablet (1,000  mg total) by mouth daily. 10/26/23   Tameria Patti, Jinnie Jansky, CNM  VYVANSE 70 MG capsule TAKE 1 CAPSULE BY MOUTH IN THE MORNING ONCE DAILY FOR 30 DAYS 11/26/17   [provider]  zolpidem (AMBIEN) 10 MG tablet Take 10 mg by mouth at bedtime as needed.    [provider]    Physical Exam Vitals: Blood pressure 121/88, pulse 88, height 5' 6 (1.676 m), weight 133 lb 4.8 oz (60.5 kg), last menstrual period 11/06/2023.  General: NAD HEENT: normocephalic, anicteric Thyroid : no enlargement, no palpable nodules Pulmonary: No increased work of breathing, CTAB Cardiovascular: RRR, distal pulses 2+ Breast: Breast symmetrical,  no tenderness, no palpable nodules or masses, no skin or nipple retraction present, no nipple discharge.  No axillary or supraclavicular lymphadenopathy. Abdomen: NABS, soft, non-tender, non-distended.  Umbilicus without lesions.  No hepatomegaly, splenomegaly or masses palpable. No evidence of hernia  Genitourinary:  External: Normal external female genitalia.  Normal urethral meatus, normal Bartholin's and Skene's glands.    Vagina: Normal vaginal mucosa, no evidence of prolapse.    Cervix: Grossly normal in appearance, no bleeding  Uterus: Non-enlarged, mobile, normal contour.  No CMT  Adnexa: ovaries non-enlarged, no adnexal masses  Rectal: deferred  Lymphatic: no evidence of inguinal lymphadenopathy Extremities: no edema, erythema, or tenderness Neurologic: Grossly intact Psychiatric: mood appropriate, affect full    Assessment: 48 y.o. G4P0 routine annual exam  Plan: Problem List Items Addressed This Visit       Digestive   GERD (gastroesophageal reflux disease) - Primary   Relevant Orders   Ambulatory referral to Gastroenterology   Other Visit Diagnoses       Encounter for well woman exam       Relevant Orders   CBC w/Diff/Platelet (Completed)   Comprehensive metabolic panel with GFR (Completed)   HEP, RPR, HIV Panel (Completed)    Hepatitis C Antibody (Completed)   TSH + free T4 (Completed)   Cytology - PAP   MM DIGITAL SCREENING BILATERAL     Other fatigue       Relevant Orders   CBC w/Diff/Platelet (Completed)   Comprehensive metabolic panel with GFR (Completed)   TSH + free T4 (Completed)     Screening examination for venereal disease       Relevant Orders   HEP, RPR, HIV Panel (Completed)   Hepatitis C Antibody (Completed)   Cytology - PAP     Cervical cancer screening       Relevant Orders   Cytology - PAP     Encounter for screening mammogram for malignant neoplasm of breast       Relevant Orders   MM DIGITAL SCREENING BILATERAL     Encounter to establish care       Relevant Orders   Ambulatory referral to Family Practice       1) Mammogram - recommend yearly screening mammogram.  Mammogram Was ordered today   2) STI screening  wasoffered and accepted  3) ASCCP guidelines and rational discussed.  Patient opts for every 5 years screening interval  4) Contraception - the patient is currently using  none.  She is happy with her current form of contraception and plans to continue  5) Colonoscopy  up to date -- Screening recommended starting at age 38 for average risk individuals, age 76 for individuals deemed at increased risk (including African Americans) and recommended to continue until age 64.  For patient age 24-85 individualized approach is recommended.  Gold standard screening is via colonoscopy, Cologuard screening is an acceptable alternative for patient unwilling or unable to undergo colonoscopy.  Colorectal cancer screening for average?risk adults: 2018 guideline update from the American Cancer SocietyCA: A Cancer Journal for Clinicians: Jul 02, 2016   6) Routine healthcare maintenance including cholesterol, diabetes screening discussed Ordered today -Referral to PCP placed   7) Return in about 1 year (around 11/17/2024).  Rec Mind your gut Book.  Discussed some of her symptoms could  be related to perimenopause. Rec regular physical activity to manage stress and perimenopausal sxs. Continue Premarin cream.   Jinnie Cookey, CNM   OB/GYN 11/19/2023, 4:29 PM

## 2023-11-22 ENCOUNTER — Other Ambulatory Visit: Payer: Self-pay | Admitting: Medical Genetics

## 2023-11-23 ENCOUNTER — Other Ambulatory Visit: Payer: Self-pay

## 2023-11-23 DIAGNOSIS — R7309 Other abnormal glucose: Secondary | ICD-10-CM

## 2023-11-23 NOTE — Progress Notes (Signed)
 Pt called and would like her HgbA1C checked Sierra messaged her recommending she get this lab).  Order placed.  Patient informed she can call to schedule lab visit.

## 2023-11-24 DIAGNOSIS — R140 Abdominal distension (gaseous): Secondary | ICD-10-CM | POA: Diagnosis not present

## 2023-11-24 DIAGNOSIS — R195 Other fecal abnormalities: Secondary | ICD-10-CM | POA: Diagnosis not present

## 2023-11-24 DIAGNOSIS — K219 Gastro-esophageal reflux disease without esophagitis: Secondary | ICD-10-CM | POA: Diagnosis not present

## 2023-11-24 LAB — CYTOLOGY - PAP
Chlamydia: NEGATIVE
Comment: NEGATIVE
Comment: NEGATIVE
Comment: NEGATIVE
Comment: NORMAL
Diagnosis: NEGATIVE
Diagnosis: REACTIVE
High risk HPV: NEGATIVE
Neisseria Gonorrhea: NEGATIVE
Trichomonas: NEGATIVE

## 2023-11-25 DIAGNOSIS — R195 Other fecal abnormalities: Secondary | ICD-10-CM | POA: Diagnosis not present

## 2023-11-25 DIAGNOSIS — R140 Abdominal distension (gaseous): Secondary | ICD-10-CM | POA: Diagnosis not present

## 2023-11-26 ENCOUNTER — Ambulatory Visit: Admitting: Family Medicine

## 2023-11-26 ENCOUNTER — Encounter: Payer: Self-pay | Admitting: Family Medicine

## 2023-11-26 VITALS — BP 124/87 | HR 77 | Temp 98.2°F | Ht 66.0 in | Wt 130.5 lb

## 2023-11-26 DIAGNOSIS — E538 Deficiency of other specified B group vitamins: Secondary | ICD-10-CM | POA: Diagnosis not present

## 2023-11-26 DIAGNOSIS — Z1231 Encounter for screening mammogram for malignant neoplasm of breast: Secondary | ICD-10-CM

## 2023-11-26 DIAGNOSIS — R0789 Other chest pain: Secondary | ICD-10-CM

## 2023-11-26 DIAGNOSIS — Z980 Intestinal bypass and anastomosis status: Secondary | ICD-10-CM

## 2023-11-26 DIAGNOSIS — R41840 Attention and concentration deficit: Secondary | ICD-10-CM

## 2023-11-26 DIAGNOSIS — G6289 Other specified polyneuropathies: Secondary | ICD-10-CM | POA: Diagnosis not present

## 2023-11-26 DIAGNOSIS — K219 Gastro-esophageal reflux disease without esophagitis: Secondary | ICD-10-CM | POA: Diagnosis not present

## 2023-11-26 DIAGNOSIS — M545 Low back pain, unspecified: Secondary | ICD-10-CM

## 2023-11-26 DIAGNOSIS — F419 Anxiety disorder, unspecified: Secondary | ICD-10-CM

## 2023-11-26 MED ORDER — TIZANIDINE HCL 4 MG PO TABS: 4.0000 mg | ORAL_TABLET | Freq: Four times a day (QID) | ORAL | 0 refills | Status: AC | PRN

## 2023-11-26 NOTE — Progress Notes (Signed)
 New patient visit   Patient: Nichole Gentry   DOB: [birth year]   48 y.o. Female  MRN: [id number] Visit Date: 11/26/2023  Today's healthcare provider: Rockie Agent, MD   Chief Complaint  Patient presents with   Establish Care    Patient presents to establish care with new pcp. Sees GI, hematology/ oncology, OBGYN, was seeing neurology Would like to see if you are able to manage lyrica , was seeing neurology but is in [place] and if you are not willing to fill she would like new referral to [facility].  Diet is normal and well balanced per patient, limits gluten due to intolerance and has GERD. Not currently exercising, is working on a plan.  Vaccines: Declines flu today Screenings: Okay with mammogram    Fatigue    Patient has concerns of fatigue, SOB and tightness in chest x one week. Patient has concerns with fluid retention/ swelling and weight fluctuation   Subjective    Nichole Gentry is a 48 y.o. female who presents today as a new patient to establish care.   HPI     Establish Care    Additional comments: Patient presents to establish care with new pcp. Sees GI, hematology/ oncology, OBGYN, was seeing neurology Would like to see if you are able to manage lyrica , was seeing neurology but is in [place] and if you are not willing to fill she would like new referral to [facility].  Diet is normal and well balanced per patient, limits gluten due to intolerance and has GERD. Not currently exercising, is working on a plan.  Vaccines: Declines flu today Screenings: Okay with mammogram         Fatigue    Additional comments: Patient has concerns of fatigue, SOB and tightness in chest x one week. Patient has concerns with fluid retention/ swelling and weight fluctuation      Last edited by Cherry Chiquita HERO, CMA on 11/26/2023  3:56 PM.       Discussed the use of AI scribe software for clinical note transcription with the patient, who gave verbal consent to  proceed.  History of Present Illness Nichole Gentry is a 48 year old female who presents to establish care as a new patient.  She has a history of peripheral neuropathy with burning sensations in her hands and feet, particularly in the palms and fingers. The pain is piercing and burning, making it difficult to hold her phone or type at work. She has experienced loss of feeling in her hand and fingers in the past, attributed to nerve issues. She was previously prescribed Lyrica  for nerve pain but did not continue with refills. She also underwent a nerve ablation for migraines, which she believes were related to nerve issues.  She has a history of pernicious anemia and B12 deficiency, for which she receives B12 injections monthly. She has been non-compliant with her B12 treatment in the past but is improving. She experiences tingling in her feet and restless leg syndrome, which she associates with her anemia and neuropathy.  She has a history of GERD and underwent a modified Roux-en-Y surgery due to severe reflux and esophageal motility issues. She experiences frequent diarrhea, described as dumping syndrome, occurring multiple times a week. Despite a gluten-free diet, the diarrhea persists. She takes Creon three times daily before meals and self-medicates with Nexium 20 mg, taking up to three tablets at a time due to persistent reflux symptoms.  She reports chronic lower back pain, described as almost  debilitating, which worsened after foot surgery and wearing a boot for eight weeks. She experiences burning pain in the sacroiliac area and pressure in the pelvic region. She has not had a lumbar MRI but has had a cervical MRI for neck pain. She attributes some of her neck pain to working from home during COVID-19. She uses heating pads for relief and takes naproxen for pain management.  She has a history of ADD and anxiety, for which she takes Vyvanse 70 mg daily and Adderall 10 mg as needed. She also has a  prescription for Ativan 0.5 mg twice daily as needed for anxiety. She experiences insomnia and takes Ambien 10 mg for sleep.  Recent lab results show elevated BUN, creatinine, and hematocrit levels, which she attributes to dehydration. She experiences cramping in her hands and feet, which she believes is related to dehydration. She drinks water, liquid IV, and Pedialyte to manage her hydration. She has a history of elevated glucose levels and is due for an A1c test.      Past Medical History:  Diagnosis Date   ADD (attention deficit disorder)    Anemia    Anxiety    Attention or concentration deficit    B12 deficiency    GERD (gastroesophageal reflux disease)    Iron  deficiency anemia following bariatric surgery    Low iron     Migraine    1-2x/month   Neuropathy    Polyneuropathy    S/P bypass gastrojejunostomy    Simple febrile convulsions (HCC)    as infant    Outpatient Medications Prior to Visit  Medication Sig   amphetamine-dextroamphetamine (ADDERALL) 10 MG tablet Take 10 mg by mouth daily as needed.   cyanocobalamin  (,VITAMIN B-12,) 1000 MCG/ML injection Inject 1 mL (1,000 mcg total) into the muscle every 30 (thirty) days. Administer weekly x 3 and then monthly.   esomeprazole (NEXIUM) 20 MG capsule Take 20 mg by mouth 2 (two) times daily before a meal.   estradiol  (ESTRACE  VAGINAL) 0.1 MG/GM vaginal cream Place 1 g vaginally daily. Apply daily x 2 weeks then taper to 2 times per week   lipase/protease/amylase (CREON) 36000 UNITS CPEP capsule Take 36,000 Units by mouth 3 (three) times daily before meals.   LORazepam (ATIVAN) 0.5 MG tablet Take 0.5 mg by mouth 2 (two) times daily as needed.   valACYclovir  (VALTREX ) 1000 MG tablet Take 1 tablet (1,000 mg total) by mouth daily.   VYVANSE 70 MG capsule TAKE 1 CAPSULE BY MOUTH IN THE MORNING ONCE DAILY FOR 30 DAYS   zolpidem (AMBIEN) 10 MG tablet Take 10 mg by mouth at bedtime as needed.   [DISCONTINUED]  Butalbital-APAP-Caffeine 50-325-40 MG capsule Take by mouth as needed for headache.   pregabalin  (LYRICA ) 75 MG capsule Take 1 capsule by mouth twice daily (Patient not taking: Reported on 11/26/2023)   SYRINGE-NEEDLE, DISP, 3 ML (BD SAFETYGLIDE SYRINGE/NEEDLE) 25G X 1 3 ML MISC Use the needle for b12 injection once a month. (Patient not taking: Reported on 11/26/2023)   [DISCONTINUED] metroNIDAZOLE  (FLAGYL ) 500 MG tablet Take 1 tablet (500 mg total) by mouth 2 (two) times daily.   [DISCONTINUED] pantoprazole (PROTONIX) 40 MG tablet Take 40 mg by mouth daily. (Patient not taking: Reported on 10/07/2023)   No facility-administered medications prior to visit.    Past Surgical History:  Procedure Laterality Date   BUNIONECTOMY Right 06/18/2022   Procedure: BUNIONECTOMY LAPIDUS TYPE;  Surgeon: Ashley Soulier, DPM;  Location: [facility];  Service: Podiatry;  Laterality: Right;   ESOPHAGOGASTRODUODENOSCOPY (EGD) WITH PROPOFOL  N/A 11/07/2019   Procedure: ESOPHAGOGASTRODUODENOSCOPY (EGD) WITH PROPOFOL ;  Surgeon: Maryruth Ole DASEN, MD;  Location: [facility];  Service: Endoscopy;  Laterality: N/A;   HIATAL HERNIA REPAIR     LAPAROSCOPIC REVISION OF ROUXENY WITH UPPER ENDOSCOPY     linx management system     Family Status  Relation Name Status   Mother  Alive   Father  Alive   Son  (Not Specified)   Mat Aunt  (Not Specified)   Mat Uncle  (Not Specified)   MGM  (Not Specified)   PGF  (Not Specified)  No partnership data on file   Family History  Problem Relation Age of Onset   Thyroid  disease Mother    Diabetes Mother    Hypertension Mother    Hyperlipidemia Father    Lung cancer Father    Brain cancer Father    Thyroid  cancer Son    Breast cancer Maternal Aunt    Cervical cancer Maternal Aunt    Pancreatic cancer Maternal Uncle    Diabetes Maternal Uncle    Dementia Maternal Grandmother    Diabetes Maternal Grandmother    Heart disease Paternal Grandfather    Social  History   Socioeconomic History   Marital status: Single    Spouse name: Not on file   Number of children: Not on file   Years of education: Not on file   Highest education level: Not on file  Occupational History   Not on file  Tobacco Use   Smoking status: Never   Smokeless tobacco: Never  Vaping Use   Vaping status: Never Used  Substance and Sexual Activity   Alcohol use: Yes    Comment: occasionally   Drug use: Never   Sexual activity: Yes    Birth control/protection: None  Other Topics Concern   Not on file  Social History Narrative   Not on file   Social Drivers of Health   Financial Resource Strain: Not on file  Food Insecurity: Food Insecurity Present (04/03/2022)   Hunger Vital Sign    Worried About Running Out of Food in the Last Year: Sometimes true    Ran Out of Food in the Last Year: Sometimes true  Transportation Needs: No Transportation Needs (04/03/2022)   PRAPARE - Administrator, Civil Service (Medical): No    Lack of Transportation (Non-Medical): No  Physical Activity: Not on file  Stress: Not on file  Social Connections: Not on file     No Known Allergies  Immunization History  Administered Date(s) Administered   Hep A / Hep B 06/16/2022, 07/22/2022, 01/11/2023   Influenza, Seasonal, Injecte, Preservative Fre 12/02/2022   Influenza,inj,Quad PF,6+ Mos 12/09/2017, 10/29/2021   Tdap 06/16/2022    Health Maintenance  Topic Date Due   Mammogram  Never done   COVID-19 Vaccine (1) 12/12/2023 (Originally 11/29/1980)   Influenza Vaccine  05/03/2024 (Originally 09/04/2023)   Cervical Cancer Screening (HPV/Pap Cotest)  11/17/2028   Colonoscopy  04/21/2029   DTaP/Tdap/Td (2 - Td or Tdap) 06/15/2032   Hepatitis B Vaccines 19-59 Average Risk  Completed   Hepatitis C Screening  Completed   HIV Screening  Completed   Pneumococcal Vaccine  Aged Out   HPV VACCINES  Aged Out   Meningococcal B Vaccine  Aged Out    Patient Care  Team: Sharma Coyer, MD as PCP - General (Family Medicine) Corcoran, Melissa C, MD (Inactive) as Referring Physician (Hematology  and Oncology) Jane, Delmar Pike, NP as Nurse Practitioner (Gastroenterology) Babara Call, MD as Consulting Physician (Hematology and Oncology) Osker Harlene BRAVO, [place] (Physician Assistant)  Review of Systems  Last CBC Lab Results  Component Value Date   WBC 5.5 11/27/2023   HGB 13.0 11/27/2023   HCT 38.5 11/27/2023   MCV 91.4 11/27/2023   MCH 30.9 11/27/2023   RDW 12.0 11/27/2023   PLT 237 11/27/2023   Last metabolic panel Lab Results  Component Value Date   GLUCOSE 161 (H) 11/18/2023   NA 137 11/18/2023   K 4.1 11/18/2023   CL 97 11/18/2023   CO2 21 11/18/2023   BUN 29 (H) 11/18/2023   CREATININE 1.01 (H) 11/18/2023   EGFR 69 11/18/2023   CALCIUM 9.2 11/18/2023   PROT 7.4 11/18/2023   ALBUMIN 4.9 11/18/2023   LABGLOB 2.5 11/18/2023   AGRATIO 1.5 04/03/2022   BILITOT 0.4 11/18/2023   ALKPHOS 101 11/18/2023   AST 19 11/18/2023   ALT 18 11/18/2023   ANIONGAP 7 05/29/2023   Last lipids No results found for: CHOL, HDL, LDLCALC, LDLDIRECT, TRIG, CHOLHDL  Last hemoglobin A1c Lab Results  Component Value Date   HGBA1C 5.6 04/03/2022        Objective    BP 124/87 (BP Location: Left Arm, Patient Position: Sitting, Cuff Size: Normal)   Pulse 77   Temp 98.2 F (36.8 C) (Oral)   Ht 5' 6 (1.676 m)   Wt 130 lb 8 oz (59.2 kg)   LMP 11/06/2023 (Exact Date)   SpO2 99%   BMI 21.06 kg/m   BP Readings from Last 3 Encounters:  11/26/23 124/87  11/18/23 121/88  10/07/23 (!) 122/90   Wt Readings from Last 3 Encounters:  11/26/23 130 lb 8 oz (59.2 kg)  11/18/23 133 lb 4.8 oz (60.5 kg)  10/07/23 131 lb 1.6 oz (59.5 kg)        Depression Screen    11/26/2023    5:11 PM 04/03/2022   10:07 AM  PHQ 2/9 Scores  PHQ - 2 Score 0 0  PHQ- 9 Score 5    No results found for any visits on  11/26/23.   Physical Exam Vitals reviewed.  Constitutional:      General: She is not in acute distress.    Appearance: Normal appearance. She is not ill-appearing.  Cardiovascular:     Rate and Rhythm: Normal rate and regular rhythm.  Pulmonary:     Effort: Pulmonary effort is normal. No respiratory distress.     Breath sounds: No wheezing, rhonchi or rales.  Abdominal:     General: Bowel sounds are normal.  Musculoskeletal:     Lumbar back: Spasms and tenderness present. Normal range of motion.  Neurological:     Mental Status: She is alert and oriented to person, place, and time.  Psychiatric:        Mood and Affect: Mood normal.        Behavior: Behavior normal.        Assessment & Plan      Problem List Items Addressed This Visit     Anxiety   Attention or concentration deficit   B12 deficiency (Chronic)   Relevant Orders   Ambulatory referral to Neurology   Folate deficiency (Chronic)   Relevant Orders   Ambulatory referral to Neurology   GERD (gastroesophageal reflux disease)   Relevant Medications   lipase/protease/amylase (CREON) 36000 UNITS CPEP capsule   esomeprazole (NEXIUM) 20 MG capsule  Peripheral neuropathy - Primary   Relevant Medications   tiZANidine (ZANAFLEX) 4 MG tablet   Other Relevant Orders   Ambulatory referral to Neurology   S/P bypass gastrojejunostomy   Other Visit Diagnoses       Encounter for screening mammogram for malignant neoplasm of breast       Relevant Orders   MM 3D SCREENING MAMMOGRAM BILATERAL BREAST     Chest wall discomfort       Relevant Orders   DG Chest 2 View     Chest tightness       Relevant Orders   DG Chest 2 View     Acute bilateral low back pain without sciatica       Relevant Medications   tiZANidine (ZANAFLEX) 4 MG tablet   Other Relevant Orders   DG Lumbar Spine Complete   Ambulatory referral to Physical Therapy        Assessment & Plan Establishing care as a new patient Comprehensive  review of medical history and current medications.  Chest Discomfort  Chest discomfort with taking a deep breath  CXR ordered   Peripheral neuropathy and restless legs syndrome, likely related to vitamin B12 deficiency anemia Chronic peripheral neuropathy with burning sensation in hands and feet, likely related to vitamin B12 deficiency. Restless legs syndrome with symptoms exacerbated by holding objects or typing. Non-compliance with B12 injections may contribute to symptoms. - Refer to neurology for further evaluation of neuropathy - Continue Lyrica  75 mg for neuropathic pain - Consider starting low-dose magnesium at night for cramping and muscle spasms  Vitamin B12 deficiency anemia and folate deficiency Chronic condition  Vitamin B12 deficiency anemia likely contributing to neuropathy. Non-compliance with monthly B12 injections reported. - Continue monthly B12 injections - Follow up with hematology for ongoing management  Chronic lumbar back pain with possible nerve compression and myofascial pain Chronic lumbar back pain with possible nerve compression and myofascial pain, bilateral and worsens with certain movements. Significant pain affecting daily activities with concerns about potential nerve involvement. No lumbar MRI performed yet. - Order lumbar x-ray to assess bony structures - Refer to physical therapy for back pain management - Consider tizanidine 4 mg every 6 hours as needed for muscle spasms  Cervical spine pain, status post ablation Chronic  Cervical spine pain, previously treated with nerve ablation. Current status not discussed in detail. Recommend follow up with established spine specialist   Gastroesophageal reflux disease and hiatal hernia, status post bariatric surgery (modified Roux-en-Y, gastric sleeve, gastroenterostomy) Chronic GERD and hiatal hernia, status post multiple bariatric surgeries. Symptoms persist despite Nexium 20 mg. Aspiration risk due to  severe reflux and inefficient esophageal motility. 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 - Follow up with GI for ongoing management - Continue Creon as prescribed  Possible dehydration Symptoms suggestive of dehydration, including cramping and fatigue. Recent labs show elevated BUN and creatinine, possibly related to dehydration. Uses pickle juice and receives IV fluids for relief. - Monitor hydration status - Discuss with hematology during upcoming appointment  Attention deficit disorder and generalized anxiety disorder Chronic  ADD and generalized anxiety disorder. Exacerbation of anxiety due to life stressors. Current medications include Vyvanse, PRN Adderall, and lorazepam PRN. - Continue Vyvanse 70 mg daily - Use Adderall 10 mg  as needed - Use lorazepam 0.5 mg as needed for anxiety -continue follow up with psychiatry specialist as scheduled   Insomnia Chronic insomnia managed with Ambien 10 mg. - Continue Ambien 10 mg for sleep  Migraine Chronic condition  Migraine, previously treated with nerve ablation. Current status not discussed in detail.  Follow-Up - Schedule follow-up in 2 months - Complete lumbar and chest x-rays at outpatient imaging center - Follow up with hematology next week - Follow up with GI for EGD and further management  Health Maintenance  Declines influenza vaccine  Mammogram ordered for breast cancer screening    Return in about 2 months (around 01/26/2024) for back pain, etc .      Rockie Agent, MD  [facility] [phone] (phone) [phone] (fax)  [facility]

## 2023-11-26 NOTE — Patient Instructions (Signed)
  Please report to [facility] located at:  [address]  [place], [place] [address]  You do not need an appointment to have xrays completed.   Our office will follow up with  results once available.     To keep you healthy, please keep in mind the following health maintenance items that you are due for:   Health Maintenance Due  Topic Date Due   Mammogram  Never done     Best Wishes,   Dr. Lang

## 2023-11-27 ENCOUNTER — Encounter: Payer: Self-pay | Admitting: Gastroenterology

## 2023-11-27 ENCOUNTER — Inpatient Hospital Stay

## 2023-11-27 DIAGNOSIS — E538 Deficiency of other specified B group vitamins: Secondary | ICD-10-CM

## 2023-11-27 DIAGNOSIS — Z803 Family history of malignant neoplasm of breast: Secondary | ICD-10-CM | POA: Diagnosis not present

## 2023-11-27 DIAGNOSIS — D508 Other iron deficiency anemias: Secondary | ICD-10-CM

## 2023-11-27 LAB — IRON AND TIBC
Iron: 103 ug/dL (ref 28–170)
Saturation Ratios: 27 % (ref 10.4–31.8)
TIBC: 386 ug/dL (ref 250–450)
UIBC: 283 ug/dL

## 2023-11-27 LAB — FERRITIN: Ferritin: 44 ng/mL (ref 11–307)

## 2023-11-27 LAB — CBC WITH DIFFERENTIAL (CANCER CENTER ONLY)
Abs Immature Granulocytes: 0.01 K/uL (ref 0.00–0.07)
Basophils Absolute: 0 K/uL (ref 0.0–0.1)
Basophils Relative: 1 %
Eosinophils Absolute: 0.2 K/uL (ref 0.0–0.5)
Eosinophils Relative: 4 %
HCT: 38.5 % (ref 36.0–46.0)
Hemoglobin: 13 g/dL (ref 12.0–15.0)
Immature Granulocytes: 0 %
Lymphocytes Relative: 42 %
Lymphs Abs: 2.3 K/uL (ref 0.7–4.0)
MCH: 30.9 pg (ref 26.0–34.0)
MCHC: 33.8 g/dL (ref 30.0–36.0)
MCV: 91.4 fL (ref 80.0–100.0)
Monocytes Absolute: 0.5 K/uL (ref 0.1–1.0)
Monocytes Relative: 9 %
Neutro Abs: 2.5 K/uL (ref 1.7–7.7)
Neutrophils Relative %: 44 %
Platelet Count: 237 K/uL (ref 150–400)
RBC: 4.21 MIL/uL (ref 3.87–5.11)
RDW: 12 % (ref 11.5–15.5)
WBC Count: 5.5 K/uL (ref 4.0–10.5)
nRBC: 0 % (ref 0.0–0.2)

## 2023-11-27 LAB — VITAMIN B12: Vitamin B-12: 397 pg/mL (ref 180–914)

## 2023-11-27 NOTE — Anesthesia Preprocedure Evaluation (Addendum)
 Anesthesia Evaluation  Patient identified by MRN, date of birth, ID band Patient awake    Reviewed: Allergy & Precautions, H&P , NPO status , Patient's Chart, lab work & pertinent test results  Airway Mallampati: I  TM Distance: >3 FB Neck ROM: Full    Dental no notable dental hx.    Pulmonary neg pulmonary ROS   Pulmonary exam normal breath sounds clear to auscultation       Cardiovascular negative cardio ROS Normal cardiovascular exam Rhythm:Regular Rate:Normal     Neuro/Psych  Headaches, Seizures -,  PSYCHIATRIC DISORDERS Anxiety      Neuromuscular disease  negative psych ROS   GI/Hepatic Neg liver ROS,GERD  Poorly Controlled,,Has had fundoplication x 2 but still has GERD, took meds today   Endo/Other  negative endocrine ROS    Renal/GU      Musculoskeletal   Abdominal   Peds  Hematology  (+) Blood dyscrasia, anemia   Anesthesia Other Findings 06-18-22 bunionectomy w/Dr. Ola  GERD (gastroesophageal reflux disease) ADD (attention deficit disorder) Migraine  Anemia Simple febrile convulsions (HCC)  Low iron  B12 deficiency  Neuropathy S/P bypass gastrojejunostomy Polyneuropathy Attention or concentration deficit Anxiety Iron  deficiency anemia following bariatric surgery     Reproductive/Obstetrics negative OB ROS                              Anesthesia Physical Anesthesia Plan  ASA: 2  Anesthesia Plan: General   Post-op Pain Management:    Induction: Intravenous  PONV Risk Score and Plan:   Airway Management Planned: Natural Airway and Nasal Cannula  Additional Equipment:   Intra-op Plan:   Post-operative Plan:   Informed Consent: I have reviewed the patients History and Physical, chart, labs and discussed the procedure including the risks, benefits and alternatives for the proposed anesthesia with the patient or authorized representative who has indicated  his/her understanding and acceptance.     Dental Advisory Given  Plan Discussed with: Anesthesiologist, CRNA and Surgeon  Anesthesia Plan Comments: (Patient consented for risks of anesthesia including but not limited to:  - adverse reactions to medications - risk of airway placement if required - damage to eyes, teeth, lips or other oral mucosa - nerve damage due to positioning  - sore throat or hoarseness - Damage to heart, brain, nerves, lungs, other parts of body or loss of life  Patient voiced understanding and assent.)         Anesthesia Quick Evaluation

## 2023-12-01 ENCOUNTER — Encounter: Payer: Self-pay | Admitting: Oncology

## 2023-12-01 ENCOUNTER — Inpatient Hospital Stay: Admitting: Oncology

## 2023-12-01 ENCOUNTER — Inpatient Hospital Stay

## 2023-12-01 ENCOUNTER — Other Ambulatory Visit

## 2023-12-01 VITALS — BP 107/72 | HR 65 | Temp 96.8°F | Resp 16 | Wt 135.5 lb

## 2023-12-01 DIAGNOSIS — K219 Gastro-esophageal reflux disease without esophagitis: Secondary | ICD-10-CM | POA: Diagnosis not present

## 2023-12-01 DIAGNOSIS — D508 Other iron deficiency anemias: Secondary | ICD-10-CM

## 2023-12-01 DIAGNOSIS — E538 Deficiency of other specified B group vitamins: Secondary | ICD-10-CM

## 2023-12-01 DIAGNOSIS — Z800 Family history of malignant neoplasm of digestive organs: Secondary | ICD-10-CM | POA: Diagnosis not present

## 2023-12-01 DIAGNOSIS — G629 Polyneuropathy, unspecified: Secondary | ICD-10-CM | POA: Diagnosis not present

## 2023-12-01 DIAGNOSIS — Z803 Family history of malignant neoplasm of breast: Secondary | ICD-10-CM | POA: Diagnosis not present

## 2023-12-01 DIAGNOSIS — Z801 Family history of malignant neoplasm of trachea, bronchus and lung: Secondary | ICD-10-CM | POA: Diagnosis not present

## 2023-12-01 DIAGNOSIS — Z79624 Long term (current) use of inhibitors of nucleotide synthesis: Secondary | ICD-10-CM | POA: Diagnosis not present

## 2023-12-01 DIAGNOSIS — D509 Iron deficiency anemia, unspecified: Secondary | ICD-10-CM | POA: Diagnosis not present

## 2023-12-01 DIAGNOSIS — K9589 Other complications of other bariatric procedure: Secondary | ICD-10-CM

## 2023-12-01 DIAGNOSIS — Z9884 Bariatric surgery status: Secondary | ICD-10-CM | POA: Diagnosis not present

## 2023-12-01 DIAGNOSIS — R5383 Other fatigue: Secondary | ICD-10-CM | POA: Diagnosis not present

## 2023-12-01 DIAGNOSIS — R7309 Other abnormal glucose: Secondary | ICD-10-CM

## 2023-12-01 DIAGNOSIS — K449 Diaphragmatic hernia without obstruction or gangrene: Secondary | ICD-10-CM | POA: Diagnosis not present

## 2023-12-01 DIAGNOSIS — Z79899 Other long term (current) drug therapy: Secondary | ICD-10-CM | POA: Diagnosis not present

## 2023-12-01 MED ORDER — CYANOCOBALAMIN 1000 MCG/ML IJ SOLN
1000.0000 ug | Freq: Once | INTRAMUSCULAR | Status: AC
  Administered 2023-12-01: 1000 ug via INTRAMUSCULAR
  Filled 2023-12-01: qty 1

## 2023-12-01 NOTE — Assessment & Plan Note (Signed)
#  Iron  deficiency anemia in the context of gastric bypass Lab results are available after her visit.  Lab Results  Component Value Date   HGB 13.0 11/27/2023   TIBC 386 11/27/2023   IRONPCTSAT 27 11/27/2023   FERRITIN 44 11/27/2023    Hold additional Venofer .

## 2023-12-01 NOTE — Progress Notes (Signed)
 Reports being very fatigued all the time.  Concerned about some labwork previously done that patient reports BUN was elevated.

## 2023-12-01 NOTE — Assessment & Plan Note (Addendum)
 B12 level has improved.  continue monthly parenteral vitamin B12 injection 1000 MCG

## 2023-12-01 NOTE — Progress Notes (Signed)
 Hematology/Oncology Progress note [phone] [phone]     Chief Complaint: Nichole Gentry is a 48 y.o. female s/p gastric bypass surgery presents for iron  deficiency and Vitamin B12 deficiency.  ASSESSMENT & PLAN:   Iron  deficiency anemia following bariatric surgery #Iron  deficiency anemia in the context of gastric bypass Lab results are available after her visit.  Lab Results  Component Value Date   HGB 13.0 11/27/2023   TIBC 386 11/27/2023   IRONPCTSAT 27 11/27/2023   FERRITIN 44 11/27/2023    Hold additional Venofer .   B12 deficiency B12 level has improved.  continue monthly parenteral vitamin B12 injection 1000 MCG   Orders Placed This Encounter  Procedures   CBC with Differential (Cancer Center Only)    Standing Status:   Future    Expected Date:   11/30/2024    Expiration Date:   02/28/2025   Iron  and TIBC    Standing Status:   Future    Expected Date:   11/30/2024    Expiration Date:   02/28/2025   Ferritin    Standing Status:   Future    Expected Date:   11/30/2024    Expiration Date:   02/28/2025   Vitamin B12    Standing Status:   Future    Expected Date:   11/30/2024    Expiration Date:   02/28/2025   Folate    Standing Status:   Future    Expected Date:   11/30/2024    Expiration Date:   02/28/2025   Follow up in 12months.  All questions were answered. The patient knows to call the clinic with any problems, questions or concerns.  Zelphia Cap, MD, PhD [facility] Hematology Oncology 12/01/2023   PERTINENT HEMATOLOGY HISTORY   # s/p gastric bypass surgery with iron  deficiency anemia.  She may have celiac disease.  She is intolerant of oral iron .  EGD in 2019 revealed a hiatal hernia, mildly dilated esophagus and dysmotility esophagus.  There were fundic gland polyps.  Duodenal biopsies revealed some increase in epithelial lymphocytes.  Colonoscopy on 04/22/2019 revealed only hemorrhoids.  She is on a gluten-free diet.  EGD on  11/07/2019 revealed salmon-colored mucosa suspicious for short-segment Barrett's esophagus. There was a small hiatal hernia. Roux-en-Y gastrojejunostomy with gastrojejunal anastomosis characterized by healthy appearing mucosa. Stomach and esophagus biopsies showed chronic inflammation. There was no intestinal metaplasia, dysplasia, and malignancy.  Patient reports feeling extremely tired today.  She missed her appointment for vitamin B12 injection last months.  She had close contact with a friend who was recently diagnosed with strep.  Patient reports some scratchy sore throat and swelling of the left neck.  No fever, chills, she took and COVID testing at home and was negative.   INTERVAL HISTORY Nichole Gentry is a 48 y.o. female who has above history reviewed by me today presents for follow up visit for Iron  deficiency anemia and B12 deficiency.   She reports feeling tired. She gets monthly B12 injections.    Past Medical History:  Diagnosis Date   ADD (attention deficit disorder)    Anemia    Anxiety    Attention or concentration deficit    B12 deficiency    GERD (gastroesophageal reflux disease)    Iron  deficiency anemia following bariatric surgery    Low iron     Migraine    1-2x/month   Neuropathy    Polyneuropathy    S/P bypass gastrojejunostomy    Simple febrile convulsions (HCC)    as  infant    Past Surgical History:  Procedure Laterality Date   BUNIONECTOMY Right 06/18/2022   Procedure: BUNIONECTOMY LAPIDUS TYPE;  Surgeon: Ashley Soulier, DPM;  Location: [facility];  Service: Podiatry;  Laterality: Right;   ESOPHAGOGASTRODUODENOSCOPY (EGD) WITH PROPOFOL  N/A 11/07/2019   Procedure: ESOPHAGOGASTRODUODENOSCOPY (EGD) WITH PROPOFOL ;  Surgeon: Maryruth Ole DASEN, MD;  Location: [facility];  Service: Endoscopy;  Laterality: N/A;   HIATAL HERNIA REPAIR     LAPAROSCOPIC REVISION OF ROUXENY WITH UPPER ENDOSCOPY     linx management system      Family History  Problem  Relation Age of Onset   Thyroid  disease Mother    Diabetes Mother    Hypertension Mother    Hyperlipidemia Father    Lung cancer Father    Brain cancer Father    Thyroid  cancer Son    Breast cancer Maternal Aunt    Cervical cancer Maternal Aunt    Pancreatic cancer Maternal Uncle    Diabetes Maternal Uncle    Dementia Maternal Grandmother    Diabetes Maternal Grandmother    Heart disease Paternal Grandfather     Social History:  reports that she has never smoked. She has never used smokeless tobacco. She reports current alcohol use. She reports that she does not use drugs. The patient is alone today.  Allergies: No Known Allergies  Current Medications: Current Outpatient Medications  Medication Sig Dispense Refill   amphetamine-dextroamphetamine (ADDERALL) 10 MG tablet Take 10 mg by mouth daily as needed.     esomeprazole (NEXIUM) 20 MG capsule Take 20 mg by mouth 2 (two) times daily before a meal.     estradiol  (ESTRACE  VAGINAL) 0.1 MG/GM vaginal cream Place 1 g vaginally daily. Apply daily x 2 weeks then taper to 2 times per week 42.5 g 1   lipase/protease/amylase (CREON) 36000 UNITS CPEP capsule Take 36,000 Units by mouth 3 (three) times daily before meals.     LORazepam (ATIVAN) 0.5 MG tablet Take 0.5 mg by mouth 2 (two) times daily as needed.     valACYclovir  (VALTREX ) 1000 MG tablet Take 1 tablet (1,000 mg total) by mouth daily. 30 tablet 0   VYVANSE 70 MG capsule TAKE 1 CAPSULE BY MOUTH IN THE MORNING ONCE DAILY FOR 30 DAYS  0   zolpidem (AMBIEN) 10 MG tablet Take 10 mg by mouth at bedtime as needed.     pregabalin  (LYRICA ) 75 MG capsule Take 1 capsule by mouth twice daily (Patient not taking: Reported on 12/01/2023) 60 capsule 0   tiZANidine (ZANAFLEX) 4 MG tablet Take 1 tablet (4 mg total) by mouth every 6 (six) hours as needed for muscle spasms. (Patient not taking: Reported on 12/01/2023) 30 tablet 0   No current facility-administered medications for this visit.      Review of Systems  Constitutional:  Positive for fatigue. Negative for appetite change, chills and fever.  HENT:   Negative for hearing loss and voice change.   Eyes:  Negative for eye problems.  Respiratory:  Negative for chest tightness and cough.   Cardiovascular:  Negative for chest pain.  Gastrointestinal:  Negative for abdominal distention, abdominal pain and blood in stool.  Endocrine: Negative for hot flashes.  Genitourinary:  Negative for difficulty urinating and frequency.   Musculoskeletal:  Positive for back pain. Negative for arthralgias.  Skin:  Negative for itching and rash.  Neurological:  Negative for extremity weakness.  Hematological:  Negative for adenopathy.  Psychiatric/Behavioral:  Negative for confusion.  Vitals Blood pressure 107/72, pulse 65, temperature (!) 96.8 F (36 C), temperature source Tympanic, resp. rate 16, weight 135 lb 8 oz (61.5 kg), last menstrual period 11/06/2023, SpO2 100%.  Performance status (ECOG): 0  Physical Exam Constitutional:      General: She is not in acute distress.    Appearance: She is not diaphoretic.  HENT:     Head: Normocephalic and atraumatic.  Eyes:     General: No scleral icterus. Cardiovascular:     Rate and Rhythm: Normal rate and regular rhythm.  Pulmonary:     Effort: Pulmonary effort is normal. No respiratory distress.  Abdominal:     General: Bowel sounds are normal. There is no distension.     Palpations: Abdomen is soft.  Musculoskeletal:        General: Normal range of motion.     Cervical back: Normal range of motion and neck supple.  Lymphadenopathy:     Cervical: No cervical adenopathy.  Skin:    General: Skin is warm.     Findings: No erythema.  Neurological:     Mental Status: She is alert and oriented to person, place, and time. Mental status is at baseline.     Motor: No abnormal muscle tone.  Psychiatric:        Mood and Affect: Mood and affect normal.      Labs are  reviewed     Latest Ref Rng & Units 11/27/2023   10:45 AM 11/18/2023    2:44 PM 05/29/2023   10:40 AM  CBC  WBC 4.0 - 10.5 K/uL 5.5  6.2  4.5   Hemoglobin 12.0 - 15.0 g/dL 86.9  84.0  86.2   Hematocrit 36.0 - 46.0 % 38.5  47.0  39.5   Platelets 150 - 400 K/uL 237  254  217       Latest Ref Rng & Units 11/18/2023    2:44 PM 05/29/2023   10:40 AM 11/28/2022   10:17 AM  CMP  Glucose 70 - 99 mg/dL 838  892  95   BUN 6 - 24 mg/dL 29  14  15    Creatinine 0.57 - 1.00 mg/dL 8.98  9.38  9.17   Sodium 134 - 144 mmol/L 137  135  136   Potassium 3.5 - 5.2 mmol/L 4.1  3.8  3.4   Chloride 96 - 106 mmol/L 97  104  104   CO2 20 - 29 mmol/L 21  24  26    Calcium 8.7 - 10.2 mg/dL 9.2  8.6  8.8   Total Protein 6.0 - 8.5 g/dL 7.4  6.7  6.3   Total Bilirubin 0.0 - 1.2 mg/dL 0.4  0.6  0.6   Alkaline Phos 41 - 116 IU/L 101  63  70   AST 0 - 40 IU/L 19  19  18    ALT 0 - 32 IU/L 18  12  14

## 2023-12-02 ENCOUNTER — Encounter: Payer: Self-pay | Admitting: Gastroenterology

## 2023-12-02 LAB — HEMOGLOBIN A1C
Est. average glucose Bld gHb Est-mCnc: 114 mg/dL
Hgb A1c MFr Bld: 5.6 % (ref 4.8–5.6)

## 2023-12-03 ENCOUNTER — Ambulatory Visit: Attending: Family Medicine | Admitting: Physical Therapy

## 2023-12-03 DIAGNOSIS — M545 Low back pain, unspecified: Secondary | ICD-10-CM | POA: Insufficient documentation

## 2023-12-03 DIAGNOSIS — M533 Sacrococcygeal disorders, not elsewhere classified: Secondary | ICD-10-CM | POA: Diagnosis not present

## 2023-12-03 DIAGNOSIS — M5459 Other low back pain: Secondary | ICD-10-CM | POA: Insufficient documentation

## 2023-12-03 NOTE — Therapy (Incomplete)
 OUTPATIENT PHYSICAL THERAPY THORACOLUMBAR EVALUATION   Patient Name: Nichole Gentry MRN: [id number] DOB:07-18-1975, 48 y.o., female Today's Date: 12/03/2023  END OF SESSION:   Past Medical History:  Diagnosis Date   ADD (attention deficit disorder)    Anemia    Anxiety    Arthritis    neck with ablation of nerves   Attention or concentration deficit    B12 deficiency    GERD (gastroesophageal reflux disease)    History of hiatal hernia    Iron  deficiency anemia following bariatric surgery    Low iron     Migraine    1-2x/month   Neuropathy    Polyneuropathy    S/P bypass gastrojejunostomy    Simple febrile convulsions (HCC)    as infant   Past Surgical History:  Procedure Laterality Date   BUNIONECTOMY Right 06/18/2022   Procedure: BUNIONECTOMY LAPIDUS TYPE;  Surgeon: Ashley Soulier, DPM;  Location: [facility];  Service: Podiatry;  Laterality: Right;   ESOPHAGOGASTRODUODENOSCOPY (EGD) WITH PROPOFOL  N/A 11/07/2019   Procedure: ESOPHAGOGASTRODUODENOSCOPY (EGD) WITH PROPOFOL ;  Surgeon: Maryruth Ole DASEN, MD;  Location: [facility];  Service: Endoscopy;  Laterality: N/A;   HIATAL HERNIA REPAIR     LAPAROSCOPIC REVISION OF ROUXENY WITH UPPER ENDOSCOPY     linx management system     Patient Active Problem List   Diagnosis Date Noted   Attention or concentration deficit 11/26/2023   Anxiety 11/26/2023   Night sweat 12/02/2022   Peripheral neuropathy 04/03/2022   Folate deficiency 03/03/2020   Iron  deficiency anemia following bariatric surgery 10/18/2019   B12 deficiency 10/18/2019   S/P bypass gastrojejunostomy 12/08/2017   S/P laparoscopic sleeve gastrectomy 10/08/2017   Postoperative anemia due to acute blood loss 06/03/2017   GERD (gastroesophageal reflux disease) 03/10/2017    PCP: ***  REFERRING PROVIDER: ***  REFERRING DIAG: M54.50 (ICD-10-CM) - Acute midline low back pain without sciatica  Rationale for Evaluation and Treatment:  Rehabilitation  THERAPY DIAG:  No diagnosis found.  ONSET DATE: >5 years ago when she fell on ice and she had back pain from this incident.     SUBJECTIVE:                                                                                                                                                                                           SUBJECTIVE STATEMENT: Pt reports that she is constantly trying to pop her back because she is uncomfortable. She has used heating pads but that has not helped much. She also reports a burning pain in her left SIJ pain that is tender to the touch. Pt reports having a foot  surgery earlier this year, and her back pain has worsened since then. Pt has a bone spur and bunion removal on her right foot earlier this year.   Pt does not report numbness or tingling that radiates down her back to her foot and n/t just confined to her left foot.   PERTINENT HISTORY:   Per Dr. Dickson Robinson-Simmons note on 11/26/23:   Chronic lumbar back pain with possible nerve compression and myofascial pain Chronic lumbar back pain with possible nerve compression and myofascial pain, bilateral and worsens with certain movements. Significant pain affecting daily activities with concerns about potential nerve involvement. No lumbar MRI performed yet. - Order lumbar x-ray to assess bony structures - Refer to physical therapy for back pain management - Consider tizanidine 4 mg every 6 hours as needed for muscle spasms    PAIN:  Are you having pain? Yes: NPRS scale: *** Pain location: *** Pain description: *** Aggravating factors: *** Relieving factors: ***  PRECAUTIONS: {Therapy precautions:24002}  RED FLAGS: {PT Red Flags:29287}   WEIGHT BEARING RESTRICTIONS: No  FALLS:  Has patient fallen in last 6 months? {fallsyesno:27318}  LIVING ENVIRONMENT: Lives with: {OP[facility] lives with:25569::lives with their family} Lives in: {Lives in:25570} Stairs: {opstairs:27293} Has  following equipment at home: {Assistive devices:23999}  OCCUPATION: Theatre Manager at [employer], walking     PLOF: Independent  PATIENT GOALS: Patient wants to feel less pain and stiffness in low back especially to sleep better    NEXT MD VISIT:   OBJECTIVE:  Note: Objective measures were completed at Evaluation unless otherwise noted.  VITAL BP 117/76  DIAGNOSTIC FINDINGS:  None listed in chart     PATIENT SURVEYS:  Modified Oswestry:  MODIFIED OSWESTRY DISABILITY SCALE  Date: *** Score  Pain intensity {ODI 1:32962}  2. Personal care (washing, dressing, etc.) {ODI 2:32963}  3. Lifting {ODI 3:32964}  4. Walking {ODI 4:32965}  5. Sitting {ODI 5:32966}  6. Standing {ODI 6:32967}  7. Sleeping {ODI 7:32968}  8. Social Life {ODI 8:32969}  9. Traveling {ODI 9:32970}  10. Employment/ Homemaking {ODI 10:32971}  Total ***/50   Interpretation of scores: Score Category Description  0-20% Minimal Disability The patient can cope with most living activities. Usually no treatment is indicated apart from advice on lifting, sitting and exercise  21-40% Moderate Disability The patient experiences more pain and difficulty with sitting, lifting and standing. Travel and social life are more difficult and they may be disabled from work. Personal care, sexual activity and sleeping are not grossly affected, and the patient can usually be managed by conservative means  41-60% Severe Disability Pain remains the main problem in this group, but activities of daily living are affected. These patients require a detailed investigation  61-80% Crippled Back pain impinges on all aspects of the patient's life. Positive intervention is required  81-100% Bed-bound These patients are either bed-bound or exaggerating their symptoms  Bluford FORBES Zoe DELENA Karon DELENA, et al. Surgery versus conservative management of stable thoracolumbar fracture: the PRESTO feasibility RCT. [place] ([place]): [employer];  2021 Nov. [facility] Technology Assessment, No. 25.62.) Appendix 3, Oswestry Disability Index category descriptors. Available from: Findjewelers.cz  Minimally Clinically Important Difference (MCID) = 12.8%  COGNITION: Overall cognitive status: Within functional limits for tasks assessed     SENSATION: Not tested  MUSCLE LENGTH: Hamstrings: Right *** deg; Left *** deg Debby test: Right *** deg; Left *** deg  POSTURE: No Significant postural limitations  PALPATION: ***  LUMBAR ROM:   AROM eval  Flexion 100%*  Extension 80%  Right lateral flexion 100%*  Left lateral flexion 100%*  Right rotation 100%  Left rotation 100%   (Blank rows = not tested)  LOWER EXTREMITY ROM:     Active  Right eval Left eval  Hip flexion    Hip extension    Hip abduction    Hip adduction    Hip internal rotation    Hip external rotation    Knee flexion    Knee extension    Ankle dorsiflexion    Ankle plantarflexion    Ankle inversion    Ankle eversion     (Blank rows = not tested)  LOWER EXTREMITY MMT:    MMT Right eval Left eval  Hip flexion    Hip extension    Hip abduction    Hip adduction    Hip internal rotation    Hip external rotation    Knee flexion    Knee extension    Ankle dorsiflexion    Ankle plantarflexion    Ankle inversion    Ankle eversion     (Blank rows = not tested)  LUMBAR SPECIAL TESTS:  {lumbar special test:25242}  FUNCTIONAL TESTS:  {Functional tests:24029}  GAIT: Distance walked: *** Assistive device utilized: {Assistive devices:23999} Level of assistance: {Levels of assistance:24026} Comments: ***  TREATMENT DATE: ***                                                                                                                                 PATIENT EDUCATION:  Education details: *** Person educated: {Person educated:25204} Education method: {Education Method:25205} Education comprehension: {Education  Comprehension:25206}  HOME EXERCISE PROGRAM: Access Code: [email or web address] URL: [email or web address] Date: 12/03/2023 Prepared by: Toribio Servant  Exercises - Standard Plank  - 1 x daily - 7 x weekly - 5 reps - 30 sec  hold  ASSESSMENT:  CLINICAL IMPRESSION: Patient is a *** y.o. *** who was seen today for physical therapy evaluation and treatment for ***.   OBJECTIVE IMPAIRMENTS: {opptimpairments:25111}.   ACTIVITY LIMITATIONS: {activitylimitations:27494}  PARTICIPATION LIMITATIONS: {participationrestrictions:25113}  PERSONAL FACTORS: {Personal factors:25162} are also affecting patient's functional outcome.   REHAB POTENTIAL: {rehabpotential:25112}  CLINICAL DECISION MAKING: {clinical decision making:25114}  EVALUATION COMPLEXITY: {Evaluation complexity:25115}   GOALS: Goals reviewed with patient? {yes/no:20286}  SHORT TERM GOALS: Target date: ***  *** Baseline: Goal status: INITIAL  2.  *** Baseline:  Goal status: INITIAL  3.  *** Baseline:  Goal status: INITIAL  4.  *** Baseline:  Goal status: INITIAL  5.  *** Baseline:  Goal status: INITIAL  6.  *** Baseline:  Goal status: INITIAL  LONG TERM GOALS: Target date: ***  *** Baseline:  Goal status: INITIAL  2.  *** Baseline:  Goal status: INITIAL  3.  *** Baseline:  Goal status: INITIAL  4.  *** Baseline:  Goal status: INITIAL  5.  *** Baseline:  Goal status: INITIAL  6.  *** Baseline:  Goal status: INITIAL  PLAN:  PT FREQUENCY: {rehab frequency:25116}  PT DURATION: {rehab duration:25117}  PLANNED INTERVENTIONS: {rehab planned interventions:25118::97110-Therapeutic exercises,97530- Therapeutic [id number]- Neuromuscular re-education,97535- Self Rjmz,02859- Manual therapy,Patient/Family education}.  PLAN FOR NEXT SESSION: ***   Toribio JINNY Servant, PT 12/03/2023, 4:49 PM

## 2023-12-04 NOTE — Progress Notes (Deleted)
   12/03/23 1600  PT Visits / Re-Eval  Visit Number 1  Number of Visits 24  Date for Recertification  02/25/24  Authorization  Authorization Type [id number]  Authorization - Visit Number 1  Progress Note Due on Visit 10  PT Time Calculation  PT Start Time 1600  PT Stop Time 1645  PT Time Calculation (min) 45 min  PT - End of Session  Activity Tolerance Patient limited by pain  Behavior During Therapy [facility] for tasks assessed/performed

## 2023-12-04 NOTE — Therapy (Addendum)
 OUTPATIENT PHYSICAL THERAPY THORACOLUMBAR EVALUATION    Discharge Summary: Patient has since sought care at a different clinic and she will now be discharged from care.   Patient Name: Nichole Gentry MRN: [id number] DOB:06/22/1975, 48 y.o., female Today's Date: 12/04/2023  END OF SESSION:  PT End of Session - 12/03/23 1600     Visit Number 1    Number of Visits 24    Date for Recertification  02/25/24    Authorization Type [id number]    Authorization - Visit Number 1    Progress Note Due on Visit 10    PT Start Time 1600    PT Stop Time 1645    PT Time Calculation (min) 45 min    Activity Tolerance Patient limited by pain    Behavior During Therapy WFL for tasks assessed/performed          Past Medical History:  Diagnosis Date   ADD (attention deficit disorder)    Anemia    Anxiety    Arthritis    neck with ablation of nerves   Attention or concentration deficit    B12 deficiency    GERD (gastroesophageal reflux disease)    History of hiatal hernia    Iron  deficiency anemia following bariatric surgery    Low iron     Migraine    1-2x/month   Neuropathy    Polyneuropathy    S/P bypass gastrojejunostomy    Simple febrile convulsions (HCC)    as infant   Past Surgical History:  Procedure Laterality Date   BUNIONECTOMY Right 06/18/2022   Procedure: BUNIONECTOMY LAPIDUS TYPE;  Surgeon: Ashley Soulier, DPM;  Location: [facility];  Service: Podiatry;  Laterality: Right;   ESOPHAGOGASTRODUODENOSCOPY (EGD) WITH PROPOFOL  N/A 11/07/2019   Procedure: ESOPHAGOGASTRODUODENOSCOPY (EGD) WITH PROPOFOL ;  Surgeon: Maryruth Ole DASEN, MD;  Location: [facility];  Service: Endoscopy;  Laterality: N/A;   HIATAL HERNIA REPAIR     LAPAROSCOPIC REVISION OF ROUXENY WITH UPPER ENDOSCOPY     linx management system     Patient Active Problem List   Diagnosis Date Noted   Attention or concentration deficit 11/26/2023   Anxiety 11/26/2023   Night sweat 12/02/2022    Peripheral neuropathy 04/03/2022   Folate deficiency 03/03/2020   Iron  deficiency anemia following bariatric surgery 10/18/2019   B12 deficiency 10/18/2019   S/P bypass gastrojejunostomy 12/08/2017   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 Rockie Robinson-Simmons    REFERRING PROVIDER: Dr. Rockie Robinson-Simmons    REFERRING DIAG: M54.50 (ICD-10-CM) - Acute midline low back pain without sciatica  Rationale for Evaluation and Treatment: Rehabilitation  THERAPY DIAG:  Other low back pain - Plan: PT plan of care cert/re-cert  Mechanical low back pain - Plan: PT plan of care cert/re-cert  Sacroiliac dysfunction - Plan: PT plan of care cert/re-cert  ONSET DATE: January 2025   SUBJECTIVE:  SUBJECTIVE STATEMENT: Pt reports that she developed back pain after sustaining a fall while slipping on ice years ago. Her low back pain was much more tolerable   PERTINENT HISTORY:  Per Dr. Rockie Simmons-Robinson 11/26/23 Chronic lumbar back pain with possible nerve compression and myofascial pain Chronic lumbar back pain with possible nerve compression and myofascial pain, bilateral and worsens with certain movements. Significant pain affecting daily activities with concerns about potential nerve involvement. No lumbar MRI performed yet. - Order lumbar x-ray to assess bony structures - Refer to physical therapy for back pain management - Consider tizanidine  4 mg every 6 hours as needed for muscle spasm  PAIN:  Are you having pain? Yes: NPRS scale: 5/10 (current), 8/10  Pain location: Bilateral SIJ and T12-L2 CPA and paraspinals  Pain description: Achy for central spinous pain    Aggravating factors: Sleeping, bending and extending her spine Relieving factors: Heating pad   PRECAUTIONS: None  RED FLAGS: None   WEIGHT BEARING RESTRICTIONS: No  FALLS:  Has patient fallen in last 6 months? No  LIVING ENVIRONMENT: Did not ask.   OCCUPATION: [employer]    PLOF: Independent  PATIENT GOALS: To have less back pain when moving and when sleeping.   NEXT MD VISIT: Did not ask   OBJECTIVE:  Note: Objective measures were completed at Evaluation unless otherwise noted.  DIAGNOSTIC FINDINGS:  None listed in chart   PATIENT SURVEYS:  Modified Oswestry:  MODIFIED OSWESTRY DISABILITY SCALE  Date: 12/04/23 Score  Pain intensity 4 =  Pain medication provides me with little relief from pain.  2. Personal care (washing, dressing, etc.) 1 =  I can take care of myself normally, but it increases my pain.  3. Lifting 3 = Pain prevents me from lifting heavy weights, but I can manage light to medium weights if they are conveniently positioned  4. Walking 1 = Pain prevents me from walking more than 1 mile.  5. Sitting 2 =  Pain prevents me from sitting more than 1 hour.  6. Standing 1 =  I can stand as long as I want but, it increases my pain.  7. Sleeping 3 =  Even when I take pain medication, I sleep less than 4 hours.  8. Social Life 2 = Pain prevents me from participating in more energetic activities (eg. sports, dancing).  9. Traveling 2 =  My pain restricts my travel over 2 hours.  10. Employment/ Homemaking 2 = I can perform most of my homemaking/job duties, but pain prevents me from performing more physically stressful activities (eg, lifting, vacuuming).  Total 21/50 (42%)   Interpretation of scores: Score Category Description  0-20% Minimal Disability The patient can cope with most living activities. Usually no treatment is indicated apart from advice on lifting, sitting and exercise  21-40% Moderate Disability The patient experiences more pain and  difficulty with sitting, lifting and standing. Travel and social life are more difficult and they may be disabled from work. Personal care, sexual activity and sleeping are not grossly affected, and the patient can usually be managed by conservative means  41-60% Severe Disability Pain remains the main problem in this group, but activities of daily living are affected. These patients require a detailed investigation  61-80% Crippled Back pain impinges on all aspects of the patient's life. Positive intervention is required  81-100% Bed-bound These patients are either bed-bound or exaggerating their symptoms  Bluford BRAVO, Zoe DELENA Karon DELENA, et al. Surgery versus conservative management of  stable thoracolumbar fracture: the PRESTO feasibility RCT. [place] ([place]): [employer]; 2021 Nov. [facility] Technology Assessment, No. 25.62.) Appendix 3, Oswestry Disability Index category descriptors. Available from: Findjewelers.cz  Minimally Clinically Important Difference (MCID) = 12.8%  COGNITION: Overall cognitive status: Within functional limits for tasks assessed     SENSATION: Not tested  MUSCLE LENGTH: Hamstrings: Right 90 deg; Left 90 deg Thomas test: Not performed  Ely's: Negative   POSTURE: No Significant postural limitations  PALPATION: Bilateral SIJ and T12-L2 Central spinous process and paraspinals TTP  LUMBAR ROM:   AROM eval  Flexion 90%*  Extension 100%*  Right lateral flexion 100%*  Left lateral flexion 100%*  Right rotation 100%  Left rotation 100%   (Blank rows = not tested)  LOWER EXTREMITY ROM:     Active  Right eval Left eval  Hip flexion    Hip extension    Hip abduction    Hip adduction    Hip internal rotation    Hip external rotation    Knee flexion    Knee extension    Ankle dorsiflexion    Ankle plantarflexion    Ankle inversion    Ankle eversion     (Blank rows = not tested)  LOWER EXTREMITY MMT:    MMT  Right eval Left eval  Hip flexion 4+ 4+  Hip extension 4+ 4+  Hip abduction 4+ 4+  Hip adduction 4 4  Hip internal rotation 4 4  Hip external rotation 4 4  Knee flexion 4+ 4+  Knee extension 4+ 4+  Ankle dorsiflexion 4+ 4+  Ankle plantarflexion    Ankle inversion    Ankle eversion     (Blank rows = not tested)  LUMBAR SPECIAL TESTS:  Straight leg raise test: Negative, Single leg stance test: nt, SI Compression/distraction test: nt , FABER test: Positive, and Trendelenburg sign: nt     GAIT:  Not assessed   TREATMENT DATE: 12/03/23  THEREX  Standard plank 3 reps with 30 sec hold  Education on TENS with pad placement and use during exercise or movement                                                                                                                                 PATIENT EDUCATION:  Education details: Education on use of TENS  Person educated: Patient Education method: Programmer, Multimedia, Facilities Manager, Verbal cues, and Handouts Education comprehension: verbalized understanding, returned demonstration, and verbal cues required  HOME EXERCISE PROGRAM: Standard Plank 5 reps with 30 sec hold 7 days per week    ASSESSMENT:  CLINICAL IMPRESSION: Patient is a 48 y.o. female who was seen today for physical therapy evaluation and treatment for chronic low back pain with R>L. She has signs and symptoms that make her most appropriate for the movement control treatment based classification group with moderate pain and disability. She did show benefit from TENS with decreased low back pain with movement.  PT recommends that pt utilize TENS to decrease pain with physical activity. Her deficits include increased low back pain with activity, decreased lumbar ROM, and muscle spasms and muscle extensibility in lumbar and thoracic paraspinals. She also has signs and symptoms of potential SIJ with pain localized over PSIS. She will benefit from skilled PT to address these aforementioned  deficits to sleep and bend without being limited by pain and discomfort.   OBJECTIVE IMPAIRMENTS: decreased ROM, hypomobility, impaired perceived functional ability, increased muscle spasms, and pain.   ACTIVITY LIMITATIONS: carrying, lifting, bending, sitting, standing, squatting, sleeping, stairs, and hygiene/grooming  PARTICIPATION LIMITATIONS: cleaning, laundry, shopping, community activity, occupation, and yard work  PERSONAL FACTORS: Age, Fitness, and 3+ comorbidities: Anxiety, h/o hiatal hernia, cervical pain are also affecting patient's functional outcome.   REHAB POTENTIAL: Good  CLINICAL DECISION MAKING: Stable/uncomplicated  EVALUATION COMPLEXITY: Low   GOALS: Goals reviewed with patient? No  SHORT TERM GOALS: Target date: 12/18/2023  Patient will demonstrate undestanding of home exercise plan by performing exercises correctly with evidence of good carry over with min to no verbal or tactile cues .   Baseline: NT  Goal status: INITIAL  2.  Patient will demonstrate understanding of how to use TENS to manage her pain by correct placement of pads.  Baseline: NT   Goal status: INITIAL  LONG TERM GOALS: Target date: 02/25/2024   Patient will improve modified Oswestry Disability Index (MODI) score by decreasing initial score by >=13% as evidence of the minimal statistically significant change for improvement with low back pain disability and improvement in low back function (Copay et al, 2008) Baseline: 42%  Goal status: INITIAL  2.  Patient will demonstrate understanding of how to maintain neutral spine during functional tasks like bending and lifting by correctly performing a hip hinge by maintaining three points of contact with PVC pipe with occiput, small of back and thoracic spine.  Baseline: NT  Goal status: INITIAL  3.  Patient will decrease pain while performing lumbar AROM in pain provoking places by not exceeding NRPS of >=4/10 as evidence of improved lumbar  function. Baseline: 8/10 for all lumbar arom with exception of rotation  Goal status: INITIAL    PLAN:  PT FREQUENCY: 1-2x/week  PT DURATION: 12 weeks  PLANNED INTERVENTIONS: 97164- PT Re-evaluation, 97750- Physical Performance Testing, 97110-Therapeutic exercises, 97530- Therapeutic activity, 97112- Neuromuscular re-education, 97535- Self Care, 02859- Manual therapy, [phone]- Gait training, [phone]- Aquatic Therapy, [phone]- Electrical stimulation (unattended), [phone]- Electrical stimulation (manual), Patient/Family education, Stair training, Taping, Joint mobilization, Joint manipulation, Spinal manipulation, Spinal mobilization, Vestibular training, DME instructions, Cryotherapy, and Moist heat.  PLAN FOR NEXT SESSION: Ask about home setup. Trendlenburg test. Give TENS handout and progress abdominal strengthening. Rule in SIJ    Toribio Servant PT, DPT  [facility] [address] [place], [address] Phone: [phone]   Fax:  [phone]

## 2023-12-07 ENCOUNTER — Ambulatory Visit: Payer: Self-pay | Admitting: Anesthesiology

## 2023-12-07 ENCOUNTER — Encounter: Payer: Self-pay | Admitting: Gastroenterology

## 2023-12-07 ENCOUNTER — Encounter
Admission: RE | Disposition: A | Discharge status: Home / Self Care | Payer: Self-pay | Source: Home / Self Care | Attending: Gastroenterology

## 2023-12-07 ENCOUNTER — Other Ambulatory Visit: Payer: Self-pay

## 2023-12-07 ENCOUNTER — Ambulatory Visit
Admission: RE | Admit: 2023-12-07 | Discharge: 2023-12-07 | Disposition: A | Discharge status: Home / Self Care | Attending: Gastroenterology | Admitting: Gastroenterology

## 2023-12-07 DIAGNOSIS — Z9889 Other specified postprocedural states: Secondary | ICD-10-CM | POA: Diagnosis not present

## 2023-12-07 DIAGNOSIS — F419 Anxiety disorder, unspecified: Secondary | ICD-10-CM | POA: Diagnosis not present

## 2023-12-07 DIAGNOSIS — K449 Diaphragmatic hernia without obstruction or gangrene: Secondary | ICD-10-CM | POA: Diagnosis not present

## 2023-12-07 DIAGNOSIS — K219 Gastro-esophageal reflux disease without esophagitis: Secondary | ICD-10-CM | POA: Diagnosis present

## 2023-12-07 DIAGNOSIS — K2270 Barrett's esophagus without dysplasia: Secondary | ICD-10-CM | POA: Insufficient documentation

## 2023-12-07 DIAGNOSIS — Z9884 Bariatric surgery status: Secondary | ICD-10-CM | POA: Diagnosis not present

## 2023-12-07 HISTORY — DX: Intestinal bypass and anastomosis status: Z98.0

## 2023-12-07 HISTORY — DX: Other iron deficiency anemias: D50.8

## 2023-12-07 HISTORY — PX: ESOPHAGOGASTRODUODENOSCOPY: SHX5428

## 2023-12-07 HISTORY — DX: Personal history of other diseases of the digestive system: Z87.19

## 2023-12-07 HISTORY — DX: Attention and concentration deficit: R41.840

## 2023-12-07 HISTORY — DX: Polyneuropathy, unspecified: G62.9

## 2023-12-07 HISTORY — DX: Anxiety disorder, unspecified: F41.9

## 2023-12-07 HISTORY — DX: Unspecified osteoarthritis, unspecified site: M19.90

## 2023-12-07 LAB — POCT PREGNANCY, URINE: Preg Test, Ur: NEGATIVE

## 2023-12-07 SURGERY — EGD (ESOPHAGOGASTRODUODENOSCOPY)
Anesthesia: General

## 2023-12-07 MED ORDER — PROPOFOL 10 MG/ML IV BOLUS
INTRAVENOUS | Status: DC | PRN
  Administered 2023-12-07: 150 mg via INTRAVENOUS
  Administered 2023-12-07: 40 mg via INTRAVENOUS

## 2023-12-07 MED ORDER — LACTATED RINGERS IV SOLN: INTRAVENOUS | Status: DC

## 2023-12-07 MED ORDER — STERILE WATER FOR IRRIGATION IR SOLN
Status: DC | PRN
  Administered 2023-12-07: 500 mL

## 2023-12-07 SURGICAL SUPPLY — 18 items
BALLN DILATOR ESOPH 8 10 CRE (MISCELLANEOUS) IMPLANT
BALLOON DILATOR 12-15 8 (BALLOONS) IMPLANT
BALLOON DILATOR 15-18 8 (BALLOONS) IMPLANT
BALLOON DILATOR CRE 0-12 8 (BALLOONS) IMPLANT
BLOCK BITE 60FR ADLT L/F GRN (MISCELLANEOUS) ×1 IMPLANT
CLIP HMST 235XBRD CATH ROT (MISCELLANEOUS) IMPLANT
ELECTRODE REM PT RTRN 9FT ADLT (ELECTROSURGICAL) IMPLANT
FORCEPS BIOP RAD 4 LRG CAP 4 (CUTTING FORCEPS) IMPLANT
GOWN CVR UNV OPN BCK APRN NK (MISCELLANEOUS) ×2 IMPLANT
INJECTOR VARIJECT VIN23 (MISCELLANEOUS) IMPLANT
KIT DEFENDO VALVE AND CONN (KITS) IMPLANT
KIT PRC NS LF DISP ENDO (KITS) ×1 IMPLANT
MANIFOLD NEPTUNE II (INSTRUMENTS) ×1 IMPLANT
MARKER SPOT ENDO TATTOO 5ML (MISCELLANEOUS) IMPLANT
RETRIEVER NET PLAT FOOD (MISCELLANEOUS) IMPLANT
SYR INFLATION 60ML (SYRINGE) IMPLANT
WATER STERILE IRR 250ML POUR (IV SOLUTION) ×1 IMPLANT
WIRE CRE 18-20MM 8CM F G (MISCELLANEOUS) IMPLANT

## 2023-12-07 NOTE — Transfer of Care (Signed)
 Immediate Anesthesia Transfer of Care Note  Patient: Nichole Gentry  Procedure(s) Performed: EGD (ESOPHAGOGASTRODUODENOSCOPY)  Patient Location: PACU  Anesthesia Type: General  Level of Consciousness: awake, alert  and patient cooperative  Airway and Oxygen Therapy: Patient Spontanous Breathing   Post-op Assessment: Post-op Vital signs reviewed, Patient's Cardiovascular Status Stable, Respiratory Function Stable, Patent Airway and No signs of Nausea or vomiting  Post-op Vital Signs: Reviewed and stable  Complications: No notable events documented.

## 2023-12-07 NOTE — Anesthesia Postprocedure Evaluation (Signed)
 Anesthesia Post Note  Patient: Hildegarde Dunaway  Procedure(s) Performed: EGD (ESOPHAGOGASTRODUODENOSCOPY)  Patient location during evaluation: PACU Anesthesia Type: General Level of consciousness: awake and alert Pain management: pain level controlled Vital Signs Assessment: post-procedure vital signs reviewed and stable Respiratory status: spontaneous breathing, nonlabored ventilation, respiratory function stable and patient connected to nasal cannula oxygen Cardiovascular status: blood pressure returned to baseline and stable Postop Assessment: no apparent nausea or vomiting Anesthetic complications: no   No notable events documented.   Last Vitals:  Vitals:   12/07/23 0929 12/07/23 0937  BP: 95/69 92/68  Pulse: 76 62  Resp: 14 16  Temp: 36.7 C 36.7 C  SpO2: 98% 100%    Last Pain:  Vitals:   12/07/23 0937  TempSrc:   PainSc: 0-No pain                 Debby Mines

## 2023-12-07 NOTE — Op Note (Signed)
 [facility] Patient Name: Nichole Gentry Procedure Date: 12/07/2023 9:07 AM MRN: [id number] Account #: [id number] Date of Birth: February 14, 1975 Admit Type: Outpatient Age: 48 Room: [facility] OR ROOM 01 Gender: Female Note Status: Finalized Instrument Name: Endoscope [id number] Procedure:             Upper GI endoscopy Indications:           Esophageal reflux symptoms that persist despite                         appropriate therapy Providers:             Corinn Jess Brooklyn MD, MD Referring MD:          Rockie Shed (Referring MD) Medicines:             General Anesthesia Complications:         No immediate complications. Estimated blood loss:                         Minimal. Procedure:             Pre-Anesthesia Assessment:                        - Prior to the procedure, a History and Physical was                         performed, and patient medications and allergies were                         reviewed. The patient is competent. The risks and                         benefits of the procedure and the sedation options and                         risks were discussed with the patient. All questions                         were answered and informed consent was obtained.                         Patient identification and proposed procedure were                         verified by the physician, the nurse, the                         anesthesiologist, the anesthetist and the technician                         in the pre-procedure area in the procedure room in the                         endoscopy suite. Mental Status Examination: alert and                         oriented. Airway Examination: normal oropharyngeal  airway and neck mobility. Respiratory Examination:                         clear to auscultation. CV Examination: normal.                         Prophylactic Antibiotics: The patient does not require                          prophylactic antibiotics. Prior Anticoagulants: The                         patient has taken no anticoagulant or antiplatelet                         agents. ASA Grade Assessment: II - A patient with mild                         systemic disease. After reviewing the risks and                         benefits, the patient was deemed in satisfactory                         condition to undergo the procedure. The anesthesia                         plan was to use general anesthesia. Immediately prior                         to administration of medications, the patient was                         re-assessed for adequacy to receive sedatives. The                         heart rate, respiratory rate, oxygen saturations,                         blood pressure, adequacy of pulmonary ventilation, and                         response to care were monitored throughout the                         procedure. The physical status of the patient was                         re-assessed after the procedure.                        After obtaining informed consent, the endoscope was                         passed under direct vision. Throughout the procedure,                         the patient's blood pressure, pulse, and oxygen  saturations were monitored continuously. The Endoscope                         was introduced through the mouth, and advanced to the                         afferent jejunal loop. The upper GI endoscopy was                         accomplished without difficulty. The patient tolerated                         the procedure well. Findings:      Evidence of a Roux-en-Y gastrojejunostomy was found. The gastrojejunal       anastomosis was characterized by healthy appearing mucosa. This was       traversed. The pouch-to-jejunum limb was characterized by healthy       appearing mucosa and an intact staple line, staple was removed.      The examined jejunum was  normal.      A small hiatal hernia was present.      Esophagogastric landmarks were identified: the gastroesophageal junction       was found at 39 cm from the incisors.      One tongue of salmon-colored mucosa was present from 38 to 39 cm. No       other visible abnormalities were present. The maximum longitudinal       extent of these esophageal mucosal changes was 1 cm in length. Biopsies       were taken with a cold forceps for histology.      The mid esophagus and distal esophagus were normal. Biopsies were taken       with a cold forceps for histology. Impression:            - Roux-en-Y gastrojejunostomy with gastrojejunal                         anastomosis characterized by healthy appearing mucosa.                        - Normal examined jejunum.                        - Small hiatal hernia.                        - Esophagogastric landmarks identified.                        - Salmon-colored mucosa suspicious for short-segment                         Barrett's esophagus. Biopsied.                        - Normal mid esophagus and distal esophagus. Biopsied. Recommendation:        - Await pathology results.                        - Discharge patient to home (with escort).                        -  Resume previous diet today.                        - Continue present medications. Procedure Code(s):     --- Professional ---                        [id number], Esophagogastroduodenoscopy, flexible,                         transoral; with biopsy, single or multiple Diagnosis Code(s):     --- Professional ---                        Z98.0, Intestinal bypass and anastomosis status                        K22.89, Other specified disease of esophagus                        K44.9, Diaphragmatic hernia without obstruction or                         gangrene                        K21.9, Gastro-esophageal reflux disease without                         esophagitis CPT copyright 2022 American  Medical Association. All rights reserved. The codes documented in this report are preliminary and upon coder review may  be revised to meet current compliance requirements. Dr. Corinn Brooklyn Corinn Jess Brooklyn MD, MD 12/07/2023 9:29:49 AM This report has been signed electronically. Number of Addenda: 0 Note Initiated On: 12/07/2023 9:07 AM Estimated Blood Loss:  Estimated blood loss: none.      [facility]

## 2023-12-07 NOTE — H&P (Signed)
 Nichole JONELLE Brooklyn, MD [facility], DHIP [address]  [place], [place] [address]  Main: [phone] Fax:  [phone] Pager: [phone]   Primary Care Physician:  Sharma Coyer, MD Primary Gastroenterologist:  Dr. Corinn JONELLE Gentry  Pre-Procedure History & Physical: HPI:  Nichole Gentry is a 48 y.o. female is here for an endoscopy.   Past Medical History:  Diagnosis Date   ADD (attention deficit disorder)    Anemia    Anxiety    Arthritis    neck with ablation of nerves   Attention or concentration deficit    B12 deficiency    GERD (gastroesophageal reflux disease)    History of hiatal hernia    Iron  deficiency anemia following bariatric surgery    Low iron     Migraine    1-2x/month   Neuropathy    Polyneuropathy    S/P bypass gastrojejunostomy    Simple febrile convulsions (HCC)    as infant    Past Surgical History:  Procedure Laterality Date   BUNIONECTOMY Right 06/18/2022   Procedure: BUNIONECTOMY LAPIDUS TYPE;  Surgeon: Ashley Soulier, DPM;  Location: [facility];  Service: Podiatry;  Laterality: Right;   ESOPHAGOGASTRODUODENOSCOPY (EGD) WITH PROPOFOL  N/A 11/07/2019   Procedure: ESOPHAGOGASTRODUODENOSCOPY (EGD) WITH PROPOFOL ;  Surgeon: Maryruth Ole DASEN, MD;  Location: [facility];  Service: Endoscopy;  Laterality: N/A;   HIATAL HERNIA REPAIR     LAPAROSCOPIC REVISION OF ROUXENY WITH UPPER ENDOSCOPY     linx management system      Prior to Admission medications   Medication Sig Start Date End Date Taking? Authorizing Provider  estradiol  (ESTRACE  VAGINAL) 0.1 MG/GM vaginal cream Place 1 g vaginally daily. Apply daily x 2 weeks then taper to 2 times per week 10/08/23  Yes Dominic, Jinnie Jansky, CNM  lipase/protease/amylase (CREON) 36000 UNITS CPEP capsule Take 36,000 Units by mouth 3 (three) times daily before meals. 11/24/23 12/24/23 Yes [provider]  LORazepam (ATIVAN) 0.5 MG tablet Take 0.5 mg by  mouth 2 (two) times daily as needed. 10/28/19  Yes [provider]  valACYclovir  (VALTREX ) 1000 MG tablet Take 1 tablet (1,000 mg total) by mouth daily. 10/26/23  Yes Dominic, Jinnie Jansky, CNM  VYVANSE 70 MG capsule TAKE 1 CAPSULE BY MOUTH IN THE MORNING ONCE DAILY FOR 30 DAYS 11/26/17  Yes [provider]  zolpidem (AMBIEN) 10 MG tablet Take 10 mg by mouth at bedtime as needed.   Yes [provider]  amphetamine-dextroamphetamine (ADDERALL) 10 MG tablet Take 10 mg by mouth daily as needed. 03/12/21   [provider]  esomeprazole (NEXIUM) 20 MG capsule Take 20 mg by mouth 2 (two) times daily before a meal.    [provider]  tiZANidine (ZANAFLEX) 4 MG tablet Take 1 tablet (4 mg total) by mouth every 6 (six) hours as needed for muscle spasms. Patient not taking: Reported on 12/01/2023 11/26/23   Sharma Coyer, MD    Allergies as of 11/24/2023   (No Known Allergies)    Family History  Problem Relation Age of Onset   Thyroid  disease Mother    Diabetes Mother    Hypertension Mother    Hyperlipidemia Father    Lung cancer Father    Brain cancer Father    Thyroid  cancer Son    Breast cancer Maternal Aunt    Cervical cancer Maternal Aunt    Pancreatic cancer Maternal Uncle    Diabetes Maternal Uncle    Dementia Maternal Grandmother  Diabetes Maternal Grandmother    Heart disease Paternal Grandfather     Social History   Socioeconomic History   Marital status: Single    Spouse name: Not on file   Number of children: Not on file   Years of education: Not on file   Highest education level: Not on file  Occupational History   Not on file  Tobacco Use   Smoking status: Never   Smokeless tobacco: Never  Vaping Use   Vaping status: Never Used  Substance and Sexual Activity   Alcohol use: Yes    Comment: occasionally   Drug use: Never   Sexual activity: Yes    Birth control/protection: None  Other Topics Concern   Not on  file  Social History Narrative   Not on file   Social Drivers of Health   Financial Resource Strain: Not on file  Food Insecurity: Food Insecurity Present (04/03/2022)   Hunger Vital Sign    Worried About Running Out of Food in the Last Year: Sometimes true    Ran Out of Food in the Last Year: Sometimes true  Transportation Needs: No Transportation Needs (04/03/2022)   PRAPARE - Administrator, Civil Service (Medical): No    Lack of Transportation (Non-Medical): No  Physical Activity: Not on file  Stress: Not on file  Social Connections: Not on file  Intimate Partner Violence: Not At Risk (04/03/2022)   Humiliation, Afraid, Rape, and Kick questionnaire    Fear of Current or Ex-Partner: No    Emotionally Abused: No    Physically Abused: No    Sexually Abused: No    Review of Systems: See HPI, otherwise negative ROS  Physical Exam: BP 108/65   Pulse 65   Temp (!) 97.1 F (36.2 C) (Temporal)   Resp 20   Ht 5' 6 (1.676 m)   Wt 60.3 kg   LMP 11/06/2023 (Exact Date)   SpO2 100%   BMI 21.47 kg/m  General:   Alert,  pleasant and cooperative in NAD Head:  Normocephalic and atraumatic. Neck:  Supple; no masses or thyromegaly. Lungs:  Clear throughout to auscultation.    Heart:  Regular rate and rhythm. Abdomen:  Soft, nontender and nondistended. Normal bowel sounds, without guarding, and without rebound.   Neurologic:  Alert and  oriented x4;  grossly normal neurologically.  Impression/Plan: Nichole Gentry is here for an endoscopy to be performed for GERD, hiatal hernia  Risks, benefits, limitations, and alternatives regarding  endoscopy have been reviewed with the patient.  Questions have been answered.  All parties agreeable.   Nichole Brooklyn, MD  12/07/2023, 8:39 AM

## 2023-12-09 LAB — SURGICAL PATHOLOGY

## 2023-12-10 ENCOUNTER — Other Ambulatory Visit: Payer: Self-pay | Admitting: Neurology

## 2023-12-10 DIAGNOSIS — M79605 Pain in left leg: Secondary | ICD-10-CM

## 2023-12-10 DIAGNOSIS — M7918 Myalgia, other site: Secondary | ICD-10-CM

## 2023-12-11 ENCOUNTER — Ambulatory Visit: Payer: Self-pay | Admitting: Gastroenterology

## 2023-12-13 ENCOUNTER — Ambulatory Visit
Admission: RE | Admit: 2023-12-13 | Discharge: 2023-12-13 | Disposition: A | Discharge status: Home / Self Care | Source: Ambulatory Visit | Attending: Neurology | Admitting: Neurology

## 2023-12-13 DIAGNOSIS — M545 Low back pain, unspecified: Secondary | ICD-10-CM | POA: Diagnosis present

## 2023-12-13 DIAGNOSIS — M79605 Pain in left leg: Secondary | ICD-10-CM | POA: Insufficient documentation

## 2023-12-13 DIAGNOSIS — M7918 Myalgia, other site: Secondary | ICD-10-CM | POA: Insufficient documentation

## 2023-12-22 ENCOUNTER — Ambulatory Visit: Admitting: Physical Therapy

## 2023-12-28 NOTE — Therapy (Incomplete)
 OUTPATIENT PHYSICAL THERAPY THORACOLUMBAR TREATMENT   Patient Name: Nichole Gentry MRN: [id number] DOB:1975/08/16, 48 y.o., female Today's Date: 12/28/2023  END OF SESSION:    Past Medical History:  Diagnosis Date   ADD (attention deficit disorder)    Anemia    Anxiety    Arthritis    neck with ablation of nerves   Attention or concentration deficit    B12 deficiency    GERD (gastroesophageal reflux disease)    History of hiatal hernia    Iron  deficiency anemia following bariatric surgery    Low iron     Migraine    1-2x/month   Neuropathy    Polyneuropathy    S/P bypass gastrojejunostomy    Simple febrile convulsions (HCC)    as infant   Past Surgical History:  Procedure Laterality Date   BUNIONECTOMY Right 06/18/2022   Procedure: BUNIONECTOMY LAPIDUS TYPE;  Surgeon: Ashley Soulier, DPM;  Location: [facility];  Service: Podiatry;  Laterality: Right;   ESOPHAGOGASTRODUODENOSCOPY N/A 12/07/2023   Procedure: EGD (ESOPHAGOGASTRODUODENOSCOPY);  Surgeon: Unk Corinn Skiff, MD;  Location: [facility];  Service: Endoscopy;  Laterality: N/A;   ESOPHAGOGASTRODUODENOSCOPY (EGD) WITH PROPOFOL  N/A 11/07/2019   Procedure: ESOPHAGOGASTRODUODENOSCOPY (EGD) WITH PROPOFOL ;  Surgeon: Maryruth Ole DASEN, MD;  Location: [facility];  Service: Endoscopy;  Laterality: N/A;   HIATAL HERNIA REPAIR     LAPAROSCOPIC REVISION OF ROUXENY WITH UPPER ENDOSCOPY     linx management system     Patient Active Problem List   Diagnosis Date Noted   Attention or concentration deficit 11/26/2023   Anxiety 11/26/2023   Night sweat 12/02/2022   Peripheral neuropathy 04/03/2022   Folate deficiency 03/03/2020   Iron  deficiency anemia following bariatric surgery 10/18/2019   B12 deficiency 10/18/2019   S/P bypass gastrojejunostomy 12/08/2017   S/P laparoscopic sleeve gastrectomy 10/08/2017   Postoperative anemia due to acute blood loss 06/03/2017   GERD (gastroesophageal reflux  disease) 03/10/2017    PCP: Dr. Rockie Robinson-Simmons    REFERRING PROVIDER: Dr. Rockie Robinson-Simmons    REFERRING DIAG: M54.50 (ICD-10-CM) - Acute midline low back pain without sciatica  Rationale for Evaluation and Treatment: Rehabilitation  THERAPY DIAG:  Other low back pain  Mechanical low back pain  Sacroiliac dysfunction  ONSET DATE: January 2025   SUBJECTIVE:                                                                                                                                                                                           SUBJECTIVE STATEMENT: ***  Pt reports that she developed back pain after sustaining a fall while slipping on ice years ago.  Her low back pain was much more tolerable   PERTINENT HISTORY:  Per Dr. Rockie Simmons-Robinson 11/26/23 Chronic lumbar back pain with possible nerve compression and myofascial pain Chronic lumbar back pain with possible nerve compression and myofascial pain, bilateral and worsens with certain movements. Significant pain affecting daily activities with concerns about potential nerve involvement. No lumbar MRI performed yet. - Order lumbar x-ray to assess bony structures - Refer to physical therapy for back pain management - Consider tizanidine  4 mg every 6 hours as needed for muscle spasm  PAIN:  Are you having pain? Yes: NPRS scale: 5/10 (current), 8/10  Pain location: Bilateral SIJ and T12-L2 CPA and paraspinals  Pain description: Achy for central spinous pain   Aggravating factors: Sleeping, bending and extending her spine Relieving factors: Heating pad   PRECAUTIONS: None  RED FLAGS: None   WEIGHT BEARING RESTRICTIONS: No  FALLS:  Has patient fallen in last 6 months? No  LIVING ENVIRONMENT: Did not ask.   OCCUPATION: [employer] Administrator    PLOF: Independent  PATIENT GOALS: To have less back pain when moving and when sleeping.   NEXT MD VISIT: Did not ask   OBJECTIVE:  Note:  Objective measures were completed at Evaluation unless otherwise noted.  DIAGNOSTIC FINDINGS:  None listed in chart   PATIENT SURVEYS:  Modified Oswestry:  MODIFIED OSWESTRY DISABILITY SCALE  Date: 12/04/23 Score  Pain intensity 4 =  Pain medication provides me with little relief from pain.  2. Personal care (washing, dressing, etc.) 1 =  I can take care of myself normally, but it increases my pain.  3. Lifting 3 = Pain prevents me from lifting heavy weights, but I can manage light to medium weights if they are conveniently positioned  4. Walking 1 = Pain prevents me from walking more than 1 mile.  5. Sitting 2 =  Pain prevents me from sitting more than 1 hour.  6. Standing 1 =  I can stand as long as I want but, it increases my pain.  7. Sleeping 3 =  Even when I take pain medication, I sleep less than 4 hours.  8. Social Life 2 = Pain prevents me from participating in more energetic activities (eg. sports, dancing).  9. Traveling 2 =  My pain restricts my travel over 2 hours.  10. Employment/ Homemaking 2 = I can perform most of my homemaking/job duties, but pain prevents me from performing more physically stressful activities (eg, lifting, vacuuming).  Total 21/50 (42%)   Interpretation of scores: Score Category Description  0-20% Minimal Disability The patient can cope with most living activities. Usually no treatment is indicated apart from advice on lifting, sitting and exercise  21-40% Moderate Disability The patient experiences more pain and difficulty with sitting, lifting and standing. Travel and social life are more difficult and they may be disabled from work. Personal care, sexual activity and sleeping are not grossly affected, and the patient can usually be managed by conservative means  41-60% Severe Disability Pain remains the main problem in this group, but activities of daily living are affected. These patients require a detailed investigation  61-80% Crippled Back pain  impinges on all aspects of the patient's life. Positive intervention is required  81-100% Bed-bound These patients are either bed-bound or exaggerating their symptoms  Bluford FORBES Zoe DELENA Karon DELENA, et al. Surgery versus conservative management of stable thoracolumbar fracture: the PRESTO feasibility RCT. [place] ([place]): [employer]; 2021 Nov. [facility] Technology Assessment, No. 25.62.)  Appendix 3, Oswestry Disability Index category descriptors. Available from: Findjewelers.cz  Minimally Clinically Important Difference (MCID) = 12.8%  COGNITION: Overall cognitive status: Within functional limits for tasks assessed     SENSATION: Not tested  MUSCLE LENGTH: Hamstrings: Right 90 deg; Left 90 deg Thomas test: Not performed  Ely's: Negative   POSTURE: No Significant postural limitations  PALPATION: Bilateral SIJ and T12-L2 Central spinous process and paraspinals TTP  LUMBAR ROM:   AROM eval  Flexion 90%*  Extension 100%*  Right lateral flexion 100%*  Left lateral flexion 100%*  Right rotation 100%  Left rotation 100%   (Blank rows = not tested)  LOWER EXTREMITY ROM:     Active  Right eval Left eval  Hip flexion    Hip extension    Hip abduction    Hip adduction    Hip internal rotation    Hip external rotation    Knee flexion    Knee extension    Ankle dorsiflexion    Ankle plantarflexion    Ankle inversion    Ankle eversion     (Blank rows = not tested)  LOWER EXTREMITY MMT:    MMT Right eval Left eval  Hip flexion 4+ 4+  Hip extension 4+ 4+  Hip abduction 4+ 4+  Hip adduction 4 4  Hip internal rotation 4 4  Hip external rotation 4 4  Knee flexion 4+ 4+  Knee extension 4+ 4+  Ankle dorsiflexion 4+ 4+  Ankle plantarflexion    Ankle inversion    Ankle eversion     (Blank rows = not tested)  LUMBAR SPECIAL TESTS:  Straight leg raise test: Negative, Single leg stance test: nt, SI Compression/distraction test: nt  , FABER test: Positive, and Trendelenburg sign: nt     GAIT:  Not assessed   TREATMENT DATE: 12/28/23 ***  THEREX  Standard plank 3 reps with 30 sec hold  Education on TENS with pad placement and use during exercise or movement                                                                                                                                 PATIENT EDUCATION:  Education details: Education on use of TENS  Person educated: Patient Education method: Programmer, Multimedia, Facilities Manager, Verbal cues, and Handouts Education comprehension: verbalized understanding, returned demonstration, and verbal cues required  HOME EXERCISE PROGRAM: Standard Plank 5 reps with 30 sec hold 7 days per week    ASSESSMENT:  CLINICAL IMPRESSION: ***  Patient is a 48 y.o. female who was seen today for physical therapy evaluation and treatment for chronic low back pain with R>L. She has signs and symptoms that make her most appropriate for the movement control treatment based classification group with moderate pain and disability. She did show benefit from TENS with decreased low back pain with movement. PT recommends that pt utilize TENS to decrease pain with physical activity. Her deficits include increased  low back pain with activity, decreased lumbar ROM, and muscle spasms and muscle extensibility in lumbar and thoracic paraspinals. She also has signs and symptoms of potential SIJ with pain localized over PSIS. She will benefit from skilled PT to address these aforementioned deficits to sleep and bend without being limited by pain and discomfort.   OBJECTIVE IMPAIRMENTS: decreased ROM, hypomobility, impaired perceived functional ability, increased muscle spasms, and pain.   ACTIVITY LIMITATIONS: carrying, lifting, bending, sitting, standing, squatting, sleeping, stairs, and hygiene/grooming  PARTICIPATION LIMITATIONS: cleaning, laundry, shopping, community activity, occupation, and yard work  PERSONAL  FACTORS: Age, Fitness, and 3+ comorbidities: Anxiety, h/o hiatal hernia, cervical pain are also affecting patient's functional outcome.   REHAB POTENTIAL: Good  CLINICAL DECISION MAKING: Stable/uncomplicated  EVALUATION COMPLEXITY: Low   GOALS: Goals reviewed with patient? No  SHORT TERM GOALS: Target date: 12/18/2023  Patient will demonstrate undestanding of home exercise plan by performing exercises correctly with evidence of good carry over with min to no verbal or tactile cues .   Baseline: NT  Goal status: INITIAL  2.  Patient will demonstrate understanding of how to use TENS to manage her pain by correct placement of pads.  Baseline: NT   Goal status: INITIAL  LONG TERM GOALS: Target date: 02/25/2024   Patient will improve modified Oswestry Disability Index (MODI) score by decreasing initial score by >=13% as evidence of the minimal statistically significant change for improvement with low back pain disability and improvement in low back function (Copay et al, 2008) Baseline: 42%  Goal status: INITIAL  2.  Patient will demonstrate understanding of how to maintain neutral spine during functional tasks like bending and lifting by correctly performing a hip hinge by maintaining three points of contact with PVC pipe with occiput, small of back and thoracic spine.  Baseline: NT  Goal status: INITIAL  3.  Patient will decrease pain while performing lumbar AROM in pain provoking places by not exceeding NRPS of >=4/10 as evidence of improved lumbar function. Baseline: 8/10 for all lumbar arom with exception of rotation  Goal status: INITIAL    PLAN:  PT FREQUENCY: 1-2x/week  PT DURATION: 12 weeks  PLANNED INTERVENTIONS: [id number]- PT Re-evaluation, 97750- Physical Performance Testing, 97110-Therapeutic exercises, 97530- Therapeutic activity, 97112- Neuromuscular re-education, 97535- Self Care, 02859- Manual therapy, [phone]- Gait training, [phone]- Aquatic Therapy, [phone]- Electrical  stimulation (unattended), [phone]- Electrical stimulation (manual), Patient/Family education, Stair training, Taping, Joint mobilization, Joint manipulation, Spinal manipulation, Spinal mobilization, Vestibular training, DME instructions, Cryotherapy, and Moist heat.  PLAN FOR NEXT SESSION: Ask about home setup. Trendlenburg test. Give TENS handout and progress abdominal strengthening. Rule in SIJ   Maryanne Finder, PT, DPT Physical Therapist - [place]  [facility]

## 2023-12-29 ENCOUNTER — Ambulatory Visit: Admitting: Physical Therapy

## 2023-12-29 DIAGNOSIS — M5459 Other low back pain: Secondary | ICD-10-CM

## 2023-12-29 DIAGNOSIS — M533 Sacrococcygeal disorders, not elsewhere classified: Secondary | ICD-10-CM

## 2024-01-04 ENCOUNTER — Inpatient Hospital Stay: Attending: Internal Medicine

## 2024-01-04 DIAGNOSIS — E538 Deficiency of other specified B group vitamins: Secondary | ICD-10-CM | POA: Insufficient documentation

## 2024-01-05 ENCOUNTER — Ambulatory Visit: Attending: Family Medicine | Admitting: Physical Therapy

## 2024-01-07 ENCOUNTER — Ambulatory Visit: Admitting: Physical Therapy

## 2024-01-07 ENCOUNTER — Telehealth: Payer: Self-pay | Admitting: Physical Therapy

## 2024-01-07 NOTE — Telephone Encounter (Signed)
 Called pt to inquire about whether she would be able to attend upcoming apt. Pt reports that she had been referred to a different clinic by her physiatrist and that she would no longer be coming to this clinic for care. PT to discharge patient from care.

## 2024-01-11 ENCOUNTER — Ambulatory Visit: Admitting: Physical Therapy

## 2024-01-13 ENCOUNTER — Ambulatory Visit: Admitting: Physical Therapy

## 2024-01-19 ENCOUNTER — Ambulatory Visit: Admitting: Physical Therapy

## 2024-01-21 ENCOUNTER — Ambulatory Visit: Admitting: Physical Therapy

## 2024-01-26 ENCOUNTER — Ambulatory Visit: Admitting: Physical Therapy

## 2024-02-01 ENCOUNTER — Ambulatory Visit: Admitting: Physical Therapy

## 2024-02-01 ENCOUNTER — Other Ambulatory Visit: Payer: Self-pay | Admitting: Licensed Practical Nurse

## 2024-02-01 DIAGNOSIS — B009 Herpesviral infection, unspecified: Secondary | ICD-10-CM

## 2024-02-03 ENCOUNTER — Ambulatory Visit: Admitting: Physical Therapy

## 2024-02-03 ENCOUNTER — Inpatient Hospital Stay

## 2024-02-03 ENCOUNTER — Other Ambulatory Visit: Payer: Self-pay | Admitting: Family Medicine

## 2024-02-03 DIAGNOSIS — E538 Deficiency of other specified B group vitamins: Secondary | ICD-10-CM | POA: Diagnosis present

## 2024-02-03 DIAGNOSIS — M5412 Radiculopathy, cervical region: Secondary | ICD-10-CM

## 2024-02-03 DIAGNOSIS — D508 Other iron deficiency anemias: Secondary | ICD-10-CM

## 2024-02-03 MED ORDER — CYANOCOBALAMIN 1000 MCG/ML IJ SOLN
1000.0000 ug | Freq: Once | INTRAMUSCULAR | Status: AC
  Administered 2024-02-03: 1000 ug via INTRAMUSCULAR
  Filled 2024-02-03: qty 1

## 2024-02-05 ENCOUNTER — Inpatient Hospital Stay

## 2024-02-08 ENCOUNTER — Inpatient Hospital Stay: Admission: RE | Admit: 2024-02-08 | Discharge: 2024-02-08 | Attending: Family Medicine | Admitting: Family Medicine

## 2024-02-08 ENCOUNTER — Encounter: Payer: Self-pay | Admitting: Oncology

## 2024-02-08 DIAGNOSIS — M5412 Radiculopathy, cervical region: Secondary | ICD-10-CM

## 2024-02-09 ENCOUNTER — Other Ambulatory Visit: Payer: Self-pay | Admitting: Medical Genetics

## 2024-02-09 ENCOUNTER — Other Ambulatory Visit

## 2024-02-09 DIAGNOSIS — Z006 Encounter for examination for normal comparison and control in clinical research program: Secondary | ICD-10-CM

## 2024-02-11 ENCOUNTER — Other Ambulatory Visit: Payer: Self-pay | Admitting: Family Medicine

## 2024-02-11 DIAGNOSIS — M5412 Radiculopathy, cervical region: Secondary | ICD-10-CM

## 2024-02-15 ENCOUNTER — Encounter: Payer: Self-pay | Admitting: Family Medicine

## 2024-02-15 ENCOUNTER — Ambulatory Visit: Admitting: Family Medicine

## 2024-02-15 VITALS — BP 107/72 | HR 82 | Ht 66.0 in | Wt 136.0 lb

## 2024-02-15 DIAGNOSIS — R238 Other skin changes: Secondary | ICD-10-CM | POA: Diagnosis not present

## 2024-02-15 DIAGNOSIS — F419 Anxiety disorder, unspecified: Secondary | ICD-10-CM

## 2024-02-15 DIAGNOSIS — K8689 Other specified diseases of pancreas: Secondary | ICD-10-CM | POA: Diagnosis not present

## 2024-02-15 DIAGNOSIS — M4802 Spinal stenosis, cervical region: Secondary | ICD-10-CM | POA: Diagnosis not present

## 2024-02-15 DIAGNOSIS — M47812 Spondylosis without myelopathy or radiculopathy, cervical region: Secondary | ICD-10-CM | POA: Diagnosis not present

## 2024-02-15 DIAGNOSIS — M47816 Spondylosis without myelopathy or radiculopathy, lumbar region: Secondary | ICD-10-CM

## 2024-02-15 DIAGNOSIS — R41840 Attention and concentration deficit: Secondary | ICD-10-CM | POA: Diagnosis not present

## 2024-02-15 DIAGNOSIS — R5381 Other malaise: Secondary | ICD-10-CM

## 2024-02-15 DIAGNOSIS — R5383 Other fatigue: Secondary | ICD-10-CM

## 2024-02-15 NOTE — Patient Instructions (Signed)
 To keep you healthy, please keep in mind the following health maintenance items that you are due for:   Health Maintenance Due  Topic Date Due   COVID-19 Vaccine (1) Never done   Mammogram  Never done     Best Wishes,   Dr. Lang

## 2024-02-15 NOTE — Progress Notes (Signed)
 "  Established Patient Office Visit  Patient ID: Nichole Gentry, female    DOB: 03-Aug-1975  Age: 49 y.o. MRN: [id number] PCP: Sharma Coyer, MD  Chief Complaint  Patient presents with   Medical Management of Chronic Issues    Patient is present for 36mo f/u with PCP    Subjective:     HPI  Discussed the use of AI scribe software for clinical note transcription with the patient, who gave verbal consent to proceed.  History of Present Illness Nichole Gentry is a 49 year old female who presents for a two-month follow-up.  She experiences ongoing fatigue, shortness of breath, and chest tightness, which have persisted since her last visit. A chest x-ray was previously ordered, but symptoms continue.  She has a history of neuropathy and was referred to neurology, where she was started on Lyrica . She continues to experience numbness and burning sensations in her hands, affecting her ability to hold objects comfortably. A nerve conduction study was performed, but results have not yet been reviewed with neurology.  She has a B12 deficiency and continues with B12 injections. Despite reportedly normal B12 levels, she feels tired and off.  She experiences lumbar back pain and was started on tizanidine  4 mg every six hours. An x-ray was performed, and she was referred to orthopedics, neurology, and physiatry. She describes her back as 'jacked up' with worsening symptoms compared to last year. An MRI showed cervical spondylosis and spinal stenosis.  She has a history of GERD and continues to use Nexium 20 mg as needed. An endoscopy and stool test revealed pancreatic insufficiency, likely due to previous surgery. She was prescribed Creon but finds it challenging to take consistently with meals and snacks.  She has ADHD and continues on Vyvanse 70 mg daily and Adderall 10 mg as needed. She also uses loratadine 0.5 mg as needed for anxiety.  She feels generally unwell, with symptoms of  fatigue, bright vision, and a sense of being 'off.' No anxiety or depression. She experiences swelling in her legs and hands, which she manages with hot baths.   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   Postoperative anemia due to acute blood loss 06/03/2017   GERD (gastroesophageal reflux disease) 03/10/2017   Past Medical History:  Diagnosis Date   ADD (attention deficit disorder)    Anemia    Anxiety    Arthritis    neck with ablation of nerves   Attention or concentration deficit    B12 deficiency    GERD (gastroesophageal reflux disease)    History of hiatal hernia    Iron  deficiency anemia following bariatric surgery    Low iron     Migraine    1-2x/month   Neuropathy    Polyneuropathy    S/P bypass gastrojejunostomy    Simple febrile convulsions (HCC)    as infant      ROS    Objective:     BP 107/72 (BP Location: Left Arm, Patient Position: Sitting, Cuff Size: Normal)   Pulse 82   Ht 5' 6 (1.676  m)   Wt 136 lb (61.7 kg)   SpO2 100%   BMI 21.95 kg/m  BP Readings from Last 3 Encounters:  02/15/24 107/72  12/07/23 92/68  12/01/23 107/72   Wt Readings from Last 3 Encounters:  02/15/24 136 lb (61.7 kg)  12/07/23 133 lb (60.3 kg)  12/01/23 135 lb 8 oz (61.5 kg)      Physical Exam Vitals reviewed.  Constitutional:      General: She is not in acute distress.    Appearance: Normal appearance. She is not ill-appearing.  Cardiovascular:     Rate and Rhythm: Normal rate and regular rhythm.   Pulmonary:     Effort: Pulmonary effort is normal. No respiratory distress.     Breath sounds: No wheezing, rhonchi or rales.  Neurological:     Mental Status: She is alert and oriented to person, place, and time.  Psychiatric:        Mood and Affect: Mood normal.        Behavior: Behavior normal.     Physical Exam EXTREMITIES: No joint swelling or tenderness, cyanosis, or edema.   No results found for any visits on 02/15/24.  Last CBC Lab Results  Component Value Date   WBC 5.5 11/27/2023   HGB 13.0 11/27/2023   HCT 38.5 11/27/2023   MCV 91.4 11/27/2023   MCH 30.9 11/27/2023   RDW 12.0 11/27/2023   PLT 237 11/27/2023   Last metabolic panel Lab Results  Component Value Date   GLUCOSE 161 (H) 11/18/2023   NA 137 11/18/2023   K 4.1 11/18/2023   CL 97 11/18/2023   CO2 21 11/18/2023   BUN 29 (H) 11/18/2023   CREATININE 1.01 (H) 11/18/2023   EGFR 69 11/18/2023   CALCIUM 9.2 11/18/2023   PROT 7.4 11/18/2023   ALBUMIN 4.9 11/18/2023   LABGLOB 2.5 11/18/2023   AGRATIO 1.5 04/03/2022   BILITOT 0.4 11/18/2023   ALKPHOS 101 11/18/2023   AST 19 11/18/2023   ALT 18 11/18/2023   ANIONGAP 7 05/29/2023   Last lipids No results found for: CHOL, HDL, LDLCALC, LDLDIRECT, TRIG, CHOLHDL Last hemoglobin A1c Lab Results  Component Value Date   HGBA1C 5.6 12/01/2023   Last thyroid  functions Lab Results  Component Value Date   TSH 1.240 11/18/2023   FREET4 1.48 11/18/2023   Last vitamin D  Lab Results  Component Value Date   VD25OH 19.96 (L) 04/03/2022   Last vitamin B12 and Folate Lab Results  Component Value Date   VITAMINB12 397 11/27/2023   FOLATE 10.4 11/28/2022      The ASCVD Risk score (Arnett DK, et al., 2019) failed to calculate for the following reasons:   Cannot find a previous HDL lab   Cannot find a previous total cholesterol lab   * - Cholesterol units were assumed  Outpatient Encounter Medications as of 02/15/2024  Medication Sig    amphetamine-dextroamphetamine (ADDERALL) 10 MG tablet Take 10 mg by mouth daily as needed.   esomeprazole (NEXIUM) 20 MG capsule Take 20 mg by mouth 2 (two) times daily before a meal.   estradiol  (ESTRACE  VAGINAL) 0.1 MG/GM vaginal cream Place 1 g vaginally daily. Apply daily x 2 weeks then taper to 2 times per week   gabapentin (NEURONTIN) 100 MG capsule Take 100 mg by mouth 2 (two) times daily.   lipase/protease/amylase (CREON) 12000-38000 units CPEP capsule Take 36,000 Units by mouth 3 (three) times daily before meals.   LORazepam (ATIVAN) 0.5 MG tablet Take 0.5 mg  by mouth 2 (two) times daily as needed.   tiZANidine  (ZANAFLEX ) 4 MG tablet Take 1 tablet (4 mg total) by mouth every 6 (six) hours as needed for muscle spasms.   valACYclovir  (VALTREX ) 1000 MG tablet Take 1 tablet by mouth once daily   VYVANSE 70 MG capsule TAKE 1 CAPSULE BY MOUTH IN THE MORNING ONCE DAILY FOR 30 DAYS   zolpidem (AMBIEN) 10 MG tablet Take 10 mg by mouth at bedtime as needed.   No facility-administered encounter medications on file as of 02/15/2024.       Assessment & Plan:   Problem List Items Addressed This Visit     Anxiety   Attention or concentration deficit   Cervical spondylosis   Cervical stenosis of spine   Relevant Orders   Anti-smooth muscle antibody, IgG   CYCLIC CITRUL PEPTIDE ANTIBODY, IGG/IGA   Rheumatoid factor   RNP Antibodies   ANA+ENA+DNA/DS+Antich+Centr   C-reactive protein   Sedimentation rate   Pancreatic insufficiency   Other Visit Diagnoses       Malaise and fatigue    -  Primary   Relevant Orders   Anti-smooth muscle antibody, IgG   CYCLIC CITRUL PEPTIDE ANTIBODY, IGG/IGA   Rheumatoid factor   RNP Antibodies   ANA+ENA+DNA/DS+Antich+Centr   C-reactive protein   Sedimentation rate     Lumbar facet arthropathy       Relevant Orders   Anti-smooth muscle antibody, IgG   CYCLIC CITRUL PEPTIDE ANTIBODY, IGG/IGA   Rheumatoid factor   RNP Antibodies    ANA+ENA+DNA/DS+Antich+Centr   C-reactive protein   Sedimentation rate     Abnormal skin color       Relevant Orders   CYCLIC CITRUL PEPTIDE ANTIBODY, IGG/IGA   RNP Antibodies   ANA+ENA+DNA/DS+Antich+Centr       Assessment and Plan Assessment & Plan Pancreatic insufficiency Chronic  Diagnosed post-surgery in 2019, confirmed by stool test. Symptoms include frequent diarrhea and fatty stools. Non-compliance with Creon due to dosing frequency and cost issues. - Encouraged adherence to Creon with every meal and snack. - Discussed manufacturer coupons and GoodRx for cost reduction.  Cervical spondylosis with cervical spinal stenosis Chronic  Progression of cervical spondylosis with mild to moderate cervical spinal stenosis at C3-C4 and C6-C7. Symptoms include numbness and pain in the neck and arms. Previous ablation performed. Current treatment includes immobilizer and planned injections for pain management. MRI shows progression compared to last year. - Continue with immobilizer use. - Proceed with planned injections for pain management. - Follow up with neurosurgery for potential further interventions.  Lumbar spondylosis with chronic back pain Chronic lumbar back pain with spondylosis. Previous treatments include tizanidine  and injections. Current plan involves further injections and potential ablation. Symptoms include pain relief with pressure application and hyperflexibility contributing to instability. - Proceed with planned injections for pain management. - Will consider ablation if injections provide adequate pain relief.  Peripheral neuropathy Chronic  Symptoms of numbness and burning in hands and arms. Previous nerve conduction study performed, but results not discussed. Symptoms may be related to cervical spondylosis or other neurological issues. - Continue follow-up with neurology for further evaluation and management.  Gastroesophageal reflux disease Chronic  GERD  managed with Nexium. Previous surgeries for reflux with persistent symptoms. Discussion of new medication Leqembi, but cost is prohibitive. - Continue Nexium 20 mg as needed. - Discussed cost management strategies for Leqembi.  Attention deficit hyperactivity disorder  chronic  ADHD managed with Vyvanse and Adderall. Current regimen does not appear  effective. - Continue Vyvanse 70 mg daily and Adderall 10 mg as needed.  Anxiety disorder Chronic  Managed with loratadipine 0.5 milligrams as needed. Current symptoms include feeling off and tired, but not anxious or depressed. - Continue ativan 0.5 milligrams as needed for anxiety. - continue follow up with behavioral health specialist   Fatigue and malaise Persistent fatigue and malaise with no clear etiology. Differential includes hormonal changes, vitamin deficiencies, or autoimmune conditions. Previous labs normal, but further evaluation needed. - Ordered rheumatologic panel including smooth muscle antibodies, CCP antibodies, rheumatoid factor, RNP antibodies, CRP, and sed rate. - Consider vitamin D  supplementation.  Abnormal skin color Intermittent abnormal skin color, possibly related to vascular issues. Differential includes Raynaud's phenomenon or vasculitis. - Ordered rheumatologic panel to evaluate for autoimmune or vasculitic conditions.    Return in about 3 months (around 05/15/2024) for rheum work up .    Rockie Agent, MD [facility]  "

## 2024-02-16 ENCOUNTER — Ambulatory Visit: Payer: Self-pay | Admitting: Family Medicine

## 2024-02-18 LAB — ANTI-SMOOTH MUSCLE ANTIBODY, IGG: Smooth Muscle Ab: 5 U (ref 0–19)

## 2024-02-18 LAB — RHEUMATOID FACTOR: Rheumatoid fact SerPl-aCnc: 10.9 [IU]/mL

## 2024-02-18 LAB — ANA+ENA+DNA/DS+ANTICH+CENTR
ANA Titer 1: NEGATIVE
Anti JO-1: <0.2 AI (ref 0.0–0.9)
Centromere Ab Screen: <0.2 AI (ref 0.0–0.9)
Chromatin Ab SerPl-aCnc: 0.2 AI (ref 0.0–0.9)
ENA RNP Ab: <0.2 AI (ref 0.0–0.9)
ENA SM Ab Ser-aCnc: <0.2 AI (ref 0.0–0.9)
ENA SSA (RO) Ab: 0.5 AI (ref 0.0–0.9)
ENA SSB (LA) Ab: <0.2 AI (ref 0.0–0.9)
Scleroderma (Scl-70) (ENA) Antibody, IgG: <0.2 AI (ref 0.0–0.9)
dsDNA Ab: <1 [IU]/mL (ref 0–9)

## 2024-02-18 LAB — C-REACTIVE PROTEIN: CRP: <1 mg/L (ref 0–10)

## 2024-02-18 LAB — SEDIMENTATION RATE: Sed Rate: 2 mm/h (ref 0–32)

## 2024-02-18 LAB — CYCLIC CITRUL PEPTIDE ANTIBODY, IGG/IGA: Cyclic Citrullin Peptide Ab: 53 U — AB (ref 0–19)

## 2024-03-07 ENCOUNTER — Inpatient Hospital Stay: Attending: Internal Medicine

## 2024-03-25 ENCOUNTER — Encounter (INDEPENDENT_AMBULATORY_CARE_PROVIDER_SITE_OTHER)

## 2024-03-25 ENCOUNTER — Encounter (INDEPENDENT_AMBULATORY_CARE_PROVIDER_SITE_OTHER): Admitting: Nurse Practitioner

## 2024-04-04 ENCOUNTER — Inpatient Hospital Stay: Attending: Internal Medicine

## 2024-05-05 ENCOUNTER — Inpatient Hospital Stay: Attending: Internal Medicine

## 2024-05-17 ENCOUNTER — Ambulatory Visit: Admitting: Family Medicine

## 2024-06-06 ENCOUNTER — Inpatient Hospital Stay: Attending: Internal Medicine

## 2024-07-07 ENCOUNTER — Inpatient Hospital Stay: Attending: Internal Medicine

## 2024-08-08 ENCOUNTER — Inpatient Hospital Stay: Attending: Internal Medicine

## 2024-09-08 ENCOUNTER — Inpatient Hospital Stay: Attending: Internal Medicine

## 2024-10-12 ENCOUNTER — Inpatient Hospital Stay: Attending: Internal Medicine

## 2024-11-11 ENCOUNTER — Inpatient Hospital Stay: Attending: Internal Medicine

## 2024-11-30 ENCOUNTER — Inpatient Hospital Stay

## 2024-12-07 ENCOUNTER — Inpatient Hospital Stay

## 2024-12-07 ENCOUNTER — Inpatient Hospital Stay: Admitting: Oncology
